# Patient Record
Sex: Female | Born: 1980 | Race: White | Hispanic: No | Marital: Married | Smoking: Never smoker
Health system: Southern US, Community
[De-identification: ages and names within clinical notes are randomized; demographics above are authoritative.]

## PROBLEM LIST (undated history)

## (undated) ENCOUNTER — Inpatient Hospital Stay (HOSPITAL_COMMUNITY): Payer: Self-pay

## (undated) DIAGNOSIS — J45909 Unspecified asthma, uncomplicated: Secondary | ICD-10-CM

## (undated) DIAGNOSIS — D696 Thrombocytopenia, unspecified: Secondary | ICD-10-CM

## (undated) DIAGNOSIS — G43909 Migraine, unspecified, not intractable, without status migrainosus: Secondary | ICD-10-CM

## (undated) DIAGNOSIS — K219 Gastro-esophageal reflux disease without esophagitis: Secondary | ICD-10-CM

## (undated) HISTORY — PX: COLONOSCOPY: SHX174

## (undated) HISTORY — PX: UPPER GI ENDOSCOPY: SHX6162

## (undated) HISTORY — DX: Unspecified asthma, uncomplicated: J45.909

## (undated) HISTORY — DX: Migraine, unspecified, not intractable, without status migrainosus: G43.909

---

## 2003-09-20 ENCOUNTER — Other Ambulatory Visit: Admission: RE | Admit: 2003-09-20 | Discharge: 2003-09-20 | Payer: Self-pay | Admitting: Obstetrics and Gynecology

## 2003-11-01 ENCOUNTER — Ambulatory Visit (HOSPITAL_COMMUNITY): Admission: RE | Admit: 2003-11-01 | Discharge: 2003-11-01 | Payer: Self-pay | Admitting: Obstetrics and Gynecology

## 2004-04-13 ENCOUNTER — Encounter (INDEPENDENT_AMBULATORY_CARE_PROVIDER_SITE_OTHER): Payer: Self-pay | Admitting: Specialist

## 2004-04-13 ENCOUNTER — Inpatient Hospital Stay (HOSPITAL_COMMUNITY): Admission: AD | Admit: 2004-04-13 | Discharge: 2004-04-16 | Payer: Self-pay | Admitting: Obstetrics and Gynecology

## 2005-05-27 ENCOUNTER — Other Ambulatory Visit: Admission: RE | Admit: 2005-05-27 | Discharge: 2005-05-27 | Payer: Self-pay | Admitting: Obstetrics and Gynecology

## 2005-10-20 ENCOUNTER — Encounter: Admission: RE | Admit: 2005-10-20 | Discharge: 2005-10-20 | Payer: Self-pay | Admitting: Family Medicine

## 2010-02-19 ENCOUNTER — Other Ambulatory Visit: Admission: RE | Admit: 2010-02-19 | Discharge: 2010-02-19 | Payer: Self-pay | Admitting: Obstetrics and Gynecology

## 2010-11-22 NOTE — H&P (Signed)
[name], [name]            ACCOUNT NO.:  [id number]   MEDICAL RECORD NO.:  [id number]          PATIENT TYPE:  MAT   LOCATION:  [facility]                          [facility]:  [facility]   PHYSICIAN:  Charles A. Delcambre, MDDATE OF BIRTH:  Dec 01, 1980   DATE OF ADMISSION:  04/09/2004  DATE OF DISCHARGE:                                HISTORY & PHYSICAL   HISTORY OF PRESENT ILLNESS:  She is a 30 year old, para 0-0-0-0 with EDC of  April 04, 2004, planned to be 41 weeks three days date of induction.  She is to be admitted on April 13, 2004 for Cytotec induction to be begun  and induced on April 14, 2004 for postdates.  She will have antenatal  testing.  AFI today is 17.6 cm.  She will have physical profile, NST on  April 12, 2004.   Prenatal labs include blood type O positive, antibody screen negative, VDRL  nonreactive.  Rubella immuned, hepatitis B surface-antigen negative.  HIV  declined.  Pap negative.  GC and Chlamydia negative.  Group B strep  negative.  Hepatitis C negative.  TSH normal.  Cystic fibrosis negative.  One hour Glucola 116.  Hemoglobin 11.9 at 28 weeks.  Quad screen normal.   PAST MEDICAL HISTORY:  1.  Asthma currently on no medications.  2.  Albuterol inhaler used p.r.n. but not currently necessary.  3.  Migraine headaches.   PAST SURGICAL HISTORY:  None.   MEDICATIONS:  Prenatal vitamins and Albuterol inhaler p.r.n.   ALLERGIES:  SULFA, reaction not stated.   SOCIAL HISTORY:  No tobacco, ethanol, or drug abuse.  The patient is married  and in a monogamous relationship with her husband.   FAMILY HISTORY:  Uncle with myocardial infarction.  Paternal grandfather  with emphysema.  Diabetes in grandfather and grandmother.  Mother with  thyroid cancer.  Brother has seizure disorder.  Brother has juvenile  arthritis.   REVIEW OF SYMPTOMS:  Denies fever, chills, nausea, vomiting, diarrhea,  constipation, bleeding, ruptured membranes, current contractions at  this  time.  She notes active fetal movement at this time.   PHYSICAL EXAMINATION:  GENERAL:  Alert and oriented x 3.  No distress.  VITAL SIGNS:  Blood pressure 122/82, respirations 16, pulse 90.  Afebrile.  HEENT:  Grossly within normal limits.  NECK:  Supple without thyromegaly or adenopathy.  LUNGS:  Clear bilaterally.  BACK:  No CVAT.  Vertebral column nontender to palpation.  HEART:  Regular rate and rhythm.  A 2/6 systolic ejection murmur at the left  sternal border.  BREASTS:  Symmetrical.  No dominant masses, tenderness, tissue, or skin  changes bilaterally.  ABDOMEN:  Gravid.  Fundal height 39 cm.  Estimated fetal weight at this time  is 3600 to 3800 gm.  Vertex on Leopold's.  PELVIC:  Vault without discharge or lesions.  Normal external female  genitalia.  Digital exam shows cervix is 1 cm, 25% effaced,  -1 station,  vertex is intact.  Midplane soft.  EXTREMITIES:  Mild edema bilaterally.  Nontender.   ASSESSMENT:  Intrauterine pregnancy at 41 weeks and 3 days.  Planned  for  this admission postdate.   PLAN:  The patient gives informed consent for Cytotec used off label for  induction, cervical ripening.  We will admit for cervical ripening with  Cytotec 25 mcg q.4h.  Plan is to switch over to high dose Pitocin the  morning after admission.  Initially admitted for Cytotec on April 13, 2004  and to begin high dose Pitocin protocol on April 14, 2004.  We will use  retained on admission labs.  She is group B strep negative.  Stadol 1 mg  q.1h. p.r.n., Phenergan 12.5 mg q.2h. p.r.n., epidural p.r.n. q.3-4h. at 3  to 4 cm dilated.  We will plan artificial rupture of membranes once cervix  is ready on the admission to augment labor.  All questions are answered.  The patient gives informed consent.  Wished to proceed as outlined.      CAD/MEDQ  D:  04/09/2004  T:  04/09/2004  Job:  [id number]

## 2010-11-22 NOTE — Op Note (Signed)
NAMEAMAL, [name]            ACCOUNT NO.:  [id number]   MEDICAL RECORD NO.:  [id number]          PATIENT TYPE:  INP   LOCATION:  9167                          [facility]ILITY:  [facility]   PHYSICIAN:  Charles A. Delcambre, MDDATE OF BIRTH:  June 16, 1981   DATE OF PROCEDURE:  04/13/2004  DATE OF DISCHARGE:                                 OPERATIVE REPORT   PREOPERATIVE DIAGNOSES:  1.  Arrest of dilation at 6 cm.  2.  Post-dates pregnancy.   POSTOPERATIVE DIAGNOSES:  1.  Arrest of dilation at 6 cm.  2.  Post-dates pregnancy.   PROCEDURE:  Primary low transverse cesarean section.   SURGEON:  Charles A. Sydnee Cabal, M.D.   ASSISTANT:  None.   COMPLICATIONS:  None.   ESTIMATED BLOOD LOSS:  600 mL.   OPERATIVE FINDINGS:  A vigorous female, Apgars 9 and 9.  Placenta to  pathology.  Cord arterial blood gas 7.30, venous blood gas not available at  this time.  See lab sheets.   ANESTHESIA:  General by the endotracheal route.   Instrument, sponge, and needle count correct x2.   DESCRIPTION OF PROCEDURE:  The patient was taken to the operating room and  placed in the supine position.  Sterile prep and drape was undertaken.  A  general anesthetic was induced.  The procedure was then undertaken.  A  Pfannenstiel incision was made with a knife, carried down to fascia, and the  fascia was incised with a knife and Mayo scissors.  The rectus sheath was  sharply dissected inferiorly and superiorly.  Rectus muscles were bluntly  dissected in the midline.  The peritoneum was entered with Metzenbaum  scissors.  Traction was used to extend the incision.  The bladder was  placed.  The vesicouterine peritoneum was incised with the Metzenbaum  scissors.  Blunt dissection was used to develop the bladder flap.  A lower  uterine segment transverse incision was made to amniotomy.  A hand was  inserted.  The occiput was lifted out of the pelvis and fundal pressure was  placed by the operator's assistant for  delivery without difficulty.  Cord  was clamped and the infant was handed off to neonatology personnel.  Cord  gases were taken.  The placenta was manually expressed and sent to  pathology.  The internal surface of the uterus was wiped with a moistened  lap.  The uterus was externalized for repair.  A single-layer closure was  undertaken with running #1 chromic.  Hemostasis was excellent, and a  judgment was made at that time with excellent hemostasis and the patient's  platelets being in the 80s, a second layer closure with the risk of starting  bleeding, a second layer would not be done.  The uterus was then re-  internalized after irrigation.  Hemostasis of the closure was again  verified.  Paracolic gutters were cleansed of clot and blood and material.  Irrigation was carried out.  Bladder flap hemostasis was excellent.  Irrigation was carried out.  Subfascial hemostasis was excellent.  The  fascia was closed with #1 Vicryl.  Subcutaneous hemostasis was excellent.  The skin was closed with skin clips.  A sterile dressing was applied.  The  patient tolerated the procedure well.  She was taken to the recovery room  with the physician in attendance.      CAD/MEDQ  D:  04/13/2004  T:  04/13/2004  Job:  [id number]

## 2010-11-22 NOTE — Discharge Summary (Signed)
NAMEHALCYON, HECK            ACCOUNT NO.:  [id number]   MEDICAL RECORD NO.:  [id number]          PATIENT TYPE:  INP   LOCATION:  9146                          [facility]   PHYSICIAN:  Charles A. Delcambre, MDDATE OF BIRTH:  10-03-1980   DATE OF ADMISSION:  04/13/2004  DATE OF DISCHARGE:  04/16/2004                                 DISCHARGE SUMMARY   DISCHARGE DIAGNOSES:  1.  Intrauterine pregnancy at 41 weeks 3 days.  2.  Arrest of dilation at 6 cm.   PROCEDURE:  Primary low transverse cesarean section.   DISPOSITION:  The patient discharged home to follow up in the office in 24  hours to discontinue staples.  She was given convalescent instructions,  notify of temperature greater than 100 degrees, increased pain or bleeding,  incisional drainage, no lifting greater than 25 pounds for 1 month, no  driving for 2 weeks.  A prescription for Percocet #40 one to two p.o. q.4h.  p.r.n., #30 of Chromagen one once a day given with instructions.  Discuss  birth control at 6 weeks.  Postoperative hematocrit 28.5, hemoglobin 9.9.  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  No  evidence of preeclampsia.  Platelets at time of surgery 81, 86 on admission.  Otherwise, pregnancy-induced hypertension labs normal with the exception of  alk phos 201, LDH at one check was 38 with normal up to 37.   HISTORY AND PHYSICAL:  As dictated on the chart.   HOSPITAL COURSE:  The patient was admitted, underwent induction, and  progressed on to labor, became arrested at dilation of 6 cm.  She was taken  for cesarean section at that time.  Surgery was accomplished without  difficulty.  She was monitored for any signs or symptoms of pregnancy-  induced hypertension and none were found.  Labs were continued to be stable  and normal with the exception of the platelets.  Platelets were stable in  the low 80s.  She had routine postoperative course otherwise.  Foley  catheter was  discontinued postoperative day #1.  She voided without  difficulty.  Pain was controlled with p.o. medication.  Diet was advanced  postoperative day #1 after flatus returned.  She had good pain control, diet  was tolerated, and she was discharged home with follow-up as noted above  postoperative day #3.      CAD/MEDQ  D:  05/01/2004  T:  05/01/2004  Job:  [id number]

## 2011-02-26 ENCOUNTER — Other Ambulatory Visit (HOSPITAL_COMMUNITY)
Admission: RE | Admit: 2011-02-26 | Discharge: 2011-02-26 | Disposition: A | Discharge status: Home / Self Care | Payer: BC Managed Care – PPO | Source: Ambulatory Visit | Attending: Obstetrics and Gynecology | Admitting: Obstetrics and Gynecology

## 2011-02-26 ENCOUNTER — Other Ambulatory Visit: Payer: Self-pay | Admitting: Obstetrics and Gynecology

## 2011-02-26 DIAGNOSIS — Z01419 Encounter for gynecological examination (general) (routine) without abnormal findings: Secondary | ICD-10-CM | POA: Insufficient documentation

## 2012-07-19 ENCOUNTER — Other Ambulatory Visit: Payer: Self-pay | Admitting: Occupational Medicine

## 2012-07-19 ENCOUNTER — Ambulatory Visit: Payer: Self-pay

## 2012-07-19 DIAGNOSIS — R52 Pain, unspecified: Secondary | ICD-10-CM

## 2014-02-15 ENCOUNTER — Other Ambulatory Visit: Payer: Self-pay | Admitting: Family Medicine

## 2014-02-15 ENCOUNTER — Encounter (INDEPENDENT_AMBULATORY_CARE_PROVIDER_SITE_OTHER): Payer: Self-pay

## 2014-02-15 ENCOUNTER — Ambulatory Visit
Admission: RE | Admit: 2014-02-15 | Discharge: 2014-02-15 | Disposition: A | Discharge status: Home / Self Care | Payer: BC Managed Care – PPO | Source: Ambulatory Visit | Attending: Family Medicine | Admitting: Family Medicine

## 2014-02-15 DIAGNOSIS — R0600 Dyspnea, unspecified: Secondary | ICD-10-CM

## 2014-02-15 DIAGNOSIS — R079 Chest pain, unspecified: Secondary | ICD-10-CM

## 2014-12-15 ENCOUNTER — Other Ambulatory Visit: Payer: Self-pay | Admitting: Physician Assistant

## 2014-12-15 ENCOUNTER — Other Ambulatory Visit (HOSPITAL_COMMUNITY)
Admission: RE | Admit: 2014-12-15 | Discharge: 2014-12-15 | Disposition: A | Discharge status: Home / Self Care | Payer: BC Managed Care – PPO | Source: Ambulatory Visit | Attending: Family Medicine | Admitting: Family Medicine

## 2014-12-15 DIAGNOSIS — Z124 Encounter for screening for malignant neoplasm of cervix: Secondary | ICD-10-CM | POA: Insufficient documentation

## 2014-12-19 LAB — CYTOLOGY - PAP

## 2015-08-30 LAB — OB RESULTS CONSOLE HGB/HCT, BLOOD
HEMATOCRIT: 40 %
HEMOGLOBIN: 13.2 g/dL

## 2015-08-30 LAB — OB RESULTS CONSOLE RPR: RPR: NONREACTIVE

## 2015-08-30 LAB — OB RESULTS CONSOLE PLATELET COUNT: Platelets: 160 10*3/uL

## 2015-08-30 LAB — OB RESULTS CONSOLE RUBELLA ANTIBODY, IGM
RUBELLA: IMMUNE
Rubella: IMMUNE
Rubella: NON-IMMUNE/NOT IMMUNE

## 2015-08-30 LAB — OB RESULTS CONSOLE HEPATITIS B SURFACE ANTIGEN: Hepatitis B Surface Ag: NEGATIVE

## 2015-08-30 LAB — OB RESULTS CONSOLE ABO/RH: RH Type: POSITIVE

## 2015-08-30 LAB — OB RESULTS CONSOLE HIV ANTIBODY (ROUTINE TESTING): HIV: NONREACTIVE

## 2015-08-30 LAB — OB RESULTS CONSOLE ANTIBODY SCREEN: Antibody Screen: NEGATIVE

## 2015-09-20 ENCOUNTER — Ambulatory Visit (INDEPENDENT_AMBULATORY_CARE_PROVIDER_SITE_OTHER): Payer: BC Managed Care – PPO | Admitting: Obstetrics & Gynecology

## 2015-09-20 ENCOUNTER — Encounter: Payer: Self-pay | Admitting: Obstetrics & Gynecology

## 2015-09-20 VITALS — BP 122/82 | HR 98 | Ht 67.0 in | Wt 158.0 lb

## 2015-09-20 DIAGNOSIS — O99519 Diseases of the respiratory system complicating pregnancy, unspecified trimester: Principal | ICD-10-CM

## 2015-09-20 DIAGNOSIS — Z3490 Encounter for supervision of normal pregnancy, unspecified, unspecified trimester: Secondary | ICD-10-CM | POA: Insufficient documentation

## 2015-09-20 DIAGNOSIS — O9989 Other specified diseases and conditions complicating pregnancy, childbirth and the puerperium: Secondary | ICD-10-CM

## 2015-09-20 DIAGNOSIS — J069 Acute upper respiratory infection, unspecified: Secondary | ICD-10-CM

## 2015-09-20 DIAGNOSIS — O34219 Maternal care for unspecified type scar from previous cesarean delivery: Secondary | ICD-10-CM

## 2015-09-20 DIAGNOSIS — Z36 Encounter for antenatal screening of mother: Secondary | ICD-10-CM

## 2015-09-20 DIAGNOSIS — J45909 Unspecified asthma, uncomplicated: Secondary | ICD-10-CM

## 2015-09-20 DIAGNOSIS — Z3481 Encounter for supervision of other normal pregnancy, first trimester: Secondary | ICD-10-CM | POA: Diagnosis not present

## 2015-09-20 NOTE — Patient Instructions (Signed)
First Trimester of Pregnancy The first trimester of pregnancy is from week 1 until the end of week 12 (months 1 through 3). A week after a sperm fertilizes an egg, the egg will implant on the wall of the uterus. This embryo will begin to develop into a baby. Genes from you and your partner are forming the baby. The female genes determine whether the baby is a boy or a girl. At 6-8 weeks, the eyes and face are formed, and the heartbeat can be seen on ultrasound. At the end of 12 weeks, all the baby's organs are formed.  Now that you are pregnant, you will want to do everything you can to have a healthy baby. Two of the most important things are to get good prenatal care and to follow your health care provider's instructions. Prenatal care is all the medical care you receive before the baby's birth. This care will help prevent, find, and treat any problems during the pregnancy and childbirth. BODY CHANGES Your body goes through many changes during pregnancy. The changes vary from woman to woman.   You may gain or lose a couple of pounds at first.  You may feel sick to your stomach (nauseous) and throw up (vomit). If the vomiting is uncontrollable, call your health care provider.  You may tire easily.  You may develop headaches that can be relieved by medicines approved by your health care provider.  You may urinate more often. Painful urination may mean you have a bladder infection.  You may develop heartburn as a result of your pregnancy.  You may develop constipation because certain hormones are causing the muscles that push waste through your intestines to slow down.  You may develop hemorrhoids or swollen, bulging veins (varicose veins).  Your breasts may begin to grow larger and become tender. Your nipples may stick out more, and the tissue that surrounds them (areola) may become darker.  Your gums may bleed and may be sensitive to brushing and flossing.  Dark spots or blotches (chloasma,  mask of pregnancy) may develop on your face. This will likely fade after the baby is born.  Your menstrual periods will stop.  You may have a loss of appetite.  You may develop cravings for certain kinds of food.  You may have changes in your emotions from day to day, such as being excited to be pregnant or being concerned that something may go wrong with the pregnancy and baby.  You may have more vivid and strange dreams.  You may have changes in your hair. These can include thickening of your hair, rapid growth, and changes in texture. Some women also have hair loss during or after pregnancy, or hair that feels dry or thin. Your hair will most likely return to normal after your baby is born. WHAT TO EXPECT AT YOUR PRENATAL VISITS During a routine prenatal visit:  You will be weighed to make sure you and the baby are growing normally.  Your blood pressure will be taken.  Your abdomen will be measured to track your baby's growth.  The fetal heartbeat will be listened to starting around week 10 or 12 of your pregnancy.  Test results from any previous visits will be discussed. Your health care provider may ask you:  How you are feeling.  If you are feeling the baby move.  If you have had any abnormal symptoms, such as leaking fluid, bleeding, severe headaches, or abdominal cramping.  If you are using any tobacco products,   including cigarettes, chewing tobacco, and electronic cigarettes.  If you have any questions. Other tests that may be performed during your first trimester include:  Blood tests to find your blood type and to check for the presence of any previous infections. They will also be used to check for low iron levels (anemia) and Rh antibodies. Later in the pregnancy, blood tests for diabetes will be done along with other tests if problems develop.  Urine tests to check for infections, diabetes, or protein in the urine.  An ultrasound to confirm the proper growth  and development of the baby.  An amniocentesis to check for possible genetic problems.  Fetal screens for spina bifida and Down syndrome.  You may need other tests to make sure you and the baby are doing well.  HIV (human immunodeficiency virus) testing. Routine prenatal testing includes screening for HIV, unless you choose not to have this test. HOME CARE INSTRUCTIONS  Medicines  Follow your health care provider's instructions regarding medicine use. Specific medicines may be either safe or unsafe to take during pregnancy.  Take your prenatal vitamins as directed.  If you develop constipation, try taking a stool softener if your health care provider approves. Diet  Eat regular, well-balanced meals. Choose a variety of foods, such as meat or vegetable-based protein, fish, milk and low-fat dairy products, vegetables, fruits, and whole grain breads and cereals. Your health care provider will help you determine the amount of weight gain that is right for you.  Avoid raw meat and uncooked cheese. These carry germs that can cause birth defects in the baby.  Eating four or five small meals rather than three large meals a day may help relieve nausea and vomiting. If you start to feel nauseous, eating a few soda crackers can be helpful. Drinking liquids between meals instead of during meals also seems to help nausea and vomiting.  If you develop constipation, eat more high-fiber foods, such as fresh vegetables or fruit and whole grains. Drink enough fluids to keep your urine clear or pale yellow. Activity and Exercise  Exercise only as directed by your health care provider. Exercising will help you:  Control your weight.  Stay in shape.  Be prepared for labor and delivery.  Experiencing pain or cramping in the lower abdomen or low back is a good sign that you should stop exercising. Check with your health care provider before continuing normal exercises.  Try to avoid standing for long  periods of time. Move your legs often if you must stand in one place for a long time.  Avoid heavy lifting.  Wear low-heeled shoes, and practice good posture.  You may continue to have sex unless your health care provider directs you otherwise. Relief of Pain or Discomfort  Wear a good support bra for breast tenderness.   Take warm sitz baths to soothe any pain or discomfort caused by hemorrhoids. Use hemorrhoid cream if your health care provider approves.   Rest with your legs elevated if you have leg cramps or low back pain.  If you develop varicose veins in your legs, wear support hose. Elevate your feet for 15 minutes, 3-4 times a day. Limit salt in your diet. Prenatal Care  Schedule your prenatal visits by the twelfth week of pregnancy. They are usually scheduled monthly at first, then more often in the last 2 months before delivery.  Write down your questions. Take them to your prenatal visits.  Keep all your prenatal visits as directed by your   health care provider. Safety  Wear your seat belt at all times when driving.  Make a list of emergency phone numbers, including numbers for family, friends, the hospital, and police and fire departments. General Tips  Ask your health care provider for a referral to a local prenatal education class. Begin classes no later than at the beginning of month 6 of your pregnancy.  Ask for help if you have counseling or nutritional needs during pregnancy. Your health care provider can offer advice or refer you to specialists for help with various needs.  Do not use hot tubs, steam rooms, or saunas.  Do not douche or use tampons or scented sanitary pads.  Do not cross your legs for long periods of time.  Avoid cat litter boxes and soil used by cats. These carry germs that can cause birth defects in the baby and possibly loss of the fetus by miscarriage or stillbirth.  Avoid all smoking, herbs, alcohol, and medicines  not prescribed by your health care provider. Chemicals in these affect the formation and growth of the baby.  Do not use any tobacco products, including cigarettes, chewing tobacco, and electronic cigarettes. If you need help quitting, ask your health care provider. You may receive counseling support and other resources to help you quit.  Schedule a dentist appointment. At home, brush your teeth with a soft toothbrush and be gentle when you floss. SEEK MEDICAL CARE IF:   You have dizziness.  You have mild pelvic cramps, pelvic pressure, or nagging pain in the abdominal area.  You have persistent nausea, vomiting, or diarrhea.  You have a bad smelling vaginal discharge.  You have pain with urination.  You notice increased swelling in your face, hands, legs, or ankles. SEEK IMMEDIATE MEDICAL CARE IF:   You have a fever.  You are leaking fluid from your vagina.  You have spotting or bleeding from your vagina.  You have severe abdominal cramping or pain.  You have rapid weight gain or loss.  You vomit blood or material that looks like coffee grounds.  You are exposed to German measles and have never had them.  You are exposed to fifth disease or chickenpox.  You develop a severe headache.  You have shortness of breath.  You have any kind of trauma, such as from a fall or a car accident.   This information is not intended to replace advice given to you by your health care provider. Make sure you discuss any questions you have with your health care provider.   Document Released: 06/17/2001 Document Revised: 07/14/2014 Document Reviewed: 05/03/2013 Elsevier Interactive Patient Education 2016 [employer]  Vaginal Birth After Cesarean Delivery Vaginal birth after cesarean delivery (VBAC) is giving birth vaginally after previously delivering a baby by a cesarean. In the past, if a woman had a cesarean delivery, all births afterward would be done by cesarean delivery. This is  no longer true. It can be safe for the mother to try a vaginal delivery after having a cesarean delivery.  It is important to discuss VBAC with your health care provider early in the pregnancy so you can understand the risks, benefits, and options. It will give you time to decide what is best in your particular case. The final decision about whether to have a VBAC or repeat cesarean delivery should be between you and your health care provider. Any changes in your health or your baby's health during your pregnancy may make it necessary to change your initial decision   decision about VBAC.  WOMEN WHO PLAN TO HAVE A VBAC SHOULD CHECK WITH THEIR HEALTH CARE PROVIDER TO BE SURE THAT:  The previous cesarean delivery was done with a low transverse uterine cut (incision) (not a vertical classical incision).   The birth canal is big enough for the baby.   There were no other operations on the uterus.   An electronic fetal monitor (EFM) will be on at all times during labor.   An operating room will be available and ready in case an emergency cesarean delivery is needed.   A health care provider and surgical nursing staff will be available at all times during labor to be ready to do an emergency delivery cesarean if necessary.   An anesthesiologist will be present in case an emergency cesarean delivery is needed.   The nursery is prepared and has adequate personnel and necessary equipment available to care for the baby in case of an emergency cesarean delivery. BENEFITS OF VBAC  Shorter stay in the hospital.   Avoidance of risks associated with cesarean delivery, such as:  Surgical complications, such as opening of the incision or hernia in the incision.  Injury to other organs.  Fever. This can occur if an infection develops after surgery. It can also occur as a reaction to the medicine given to make you numb during the surgery.  Less blood loss and need for blood transfusions.  Lower risk of blood clots and  infection.  Shorter recovery.   Decreased risk for having to remove the uterus (hysterectomy).   Decreased risk for the placenta to completely or partially cover the opening of the uterus (placenta previa) with a future pregnancy.   Decrease risk in future labor and delivery. RISKS OF A VBAC  Tearing (rupture) of the uterus. This is occurs in less than 1% of VBACs. The risk of this happening is higher if:  Steps are taken to begin the labor process (induce labor) or stimulate or strengthen contractions (augment labor).   Medicine is used to soften (ripen) the cervix.  Having to remove the uterus (hysterectomy) if it ruptures. VBAC SHOULD NOT BE DONE IF:  The previous cesarean delivery was done with a vertical (classical) or T-shaped incision or you do not know what kind of incision was made.   You had a ruptured uterus.   You have had certain types of surgery on your uterus, such as removal of uterine fibroids. Ask your health care provider about other types of surgeries that prevent you from having a VBAC.  You have certain medical or childbirth (obstetrical) problems.   There are problems with the baby.   You have had two previous cesarean deliveries and no vaginal deliveries. OTHER FACTS TO KNOW ABOUT VBAC:  It is safe to have an epidural anesthetic with VBAC.   It is safe to turn the baby from a breech position (attempt an external cephalic version).   It is safe to try a VBAC with twins.   VBAC may not be successful if your baby weights 8.8 lb (4 kg) or more. However, weight predictions are not always accurate and should not be used alone to decide if VBAC is right for you.  There is an increased failure rate if the time between the cesarean delivery and VBAC is less than 19 months.   Your health care provider may advise against a VBAC if you have preeclampsia (high blood pressure, protein in the urine, and swelling of face and extremities).  VBAC is often successful if you previously gave birth vaginally.   VBAC is often successful when the labor starts spontaneously before the due date.   Delivering a baby through a VBAC is similar to having a normal spontaneous vaginal delivery.   This information is not intended to replace advice given to you by your health care provider. Make sure you discuss any questions you have with your health care provider.   Document Released: 12/14/2006 Document Revised: 07/14/2014 Document Reviewed: 01/20/2013 Elsevier Interactive Patient Education 2016 [employer]  

## 2015-09-20 NOTE — Progress Notes (Signed)
Subjective:    [name] is a 35 y.o. G2P1001 at 4737w2d, by LMP consistent with today's scan, being seen today for her first obstetrical visit in this clinic.  [facility], had a visit there and had prenatal labs done.  She wanted to come here due to proximity of this clinic to her home, understands that [facility] also [facility].   Her obstetrical history is significant for previous cesarean section for arrest of dilation.  Also had gestational thrombocytopenia and platelet count was 80K at delivery, general anesthesia utilized. Patient does intend to breast feed. Pregnancy history fully reviewed.  Patient reports URI symptoms (cough, sneezing). No fevers, chills, myalgia or other concerns.  Wants to know what she can take.  Has been using albuterol neb treatments as needed.  Accompanied by her husband.   Filed Vitals:   09/20/15 0842 09/20/15 0856  BP: 122/82   Pulse: 98   Height:  5\' 7"  (1.702 m)  Weight: 158 lb (71.668 kg)     HISTORY: OB History  Gravida Para Term Preterm AB SAB TAB Ectopic Multiple Living  2 1 1  0 0 0 0 0 0 1    # Outcome Date GA Lbr Len/2nd Weight Sex Delivery Anes PTL Lv  2 Current           1 Term 04/13/04 3274w3d       Y     Past Medical History  Diagnosis Date  . Asthma   . Migraines    Past Surgical History  Procedure Laterality Date  . Cesarean section  10.02/2004  . Upper gi endoscopy      35 years old  . Colonoscopy      35 years old   Family History  Problem Relation Age of Onset  . Diabetes Mother   . Cervical cancer Maternal Grandmother   . Cervical cancer Paternal Grandmother   . Breast cancer Maternal Aunt   . Anuerysm Mother   . Mitral valve prolapse Mother   . Fibromyalgia Mother      Exam    Uterus:     Pelvic Exam: Deferred  System: Breast:  Deferred   Skin: normal coloration and turgor, no rashes   Neurologic: oriented, normal, negative   Extremities: normal strength, tone,  and muscle mass, no deformities   HEENT PERRLA, extra ocular movement intact and sclera clear, anicteric   Mouth/Teeth mucous membranes moist, pharynx normal without lesions and dental hygiene good   Neck supple and no masses   Cardiovascular: regular rate and rhythm   Respiratory:  appears well, vitals normal, no respiratory distress, acyanotic, normal RR, nasal congestion with pale boggy mucosa noted, chest clear, no wheezing, crepitations, rhonchi, normal symmetric air entry   Abdomen: soft, non-tender; bowel sounds normal; no masses,  no organomegaly      Assessment:    Pregnancy: G2P1001 Patient Active Problem List   Diagnosis Date Noted  . Asthma affecting pregnancy, antepartum 09/20/2015  . Supervision of normal pregnancy, antepartum 09/20/2015  . Previous cesarean delivery x1 in 2005 09/20/2015    Plan:     [facility] and initial labs reviewed Continue Prenatal vitamins. Recommended OTC antitussive agents, also recommended Singulair or other asthma maintenance medication. She will follow up with PCP. Patient enrolled in Bab[facility]s program. Problem list reviewed and updated. Genetic Screening discussed; will decide by next visit in 2 weeks. Ultrasound discussed; fetal survey: to be ordered later. The nature of [facility] with multiple MDs and other Advanced Practice Providers was explained to patient; also emphasized that residents, students are part of our team. Routine obstetric precautions reviewed.    Tereso Newcomer, MD 09/20/2015

## 2015-09-20 NOTE — Progress Notes (Signed)
Last pap 12/2014 - all paps have been normal since 2003.  Bedside US shows single IUP 6553w4d and FHR 178

## 2015-09-24 ENCOUNTER — Encounter: Payer: Self-pay | Admitting: *Deleted

## 2015-10-01 ENCOUNTER — Ambulatory Visit (INDEPENDENT_AMBULATORY_CARE_PROVIDER_SITE_OTHER): Payer: BC Managed Care – PPO | Admitting: Obstetrics & Gynecology

## 2015-10-01 VITALS — BP 130/86 | HR 91 | Wt 160.0 lb

## 2015-10-01 DIAGNOSIS — Z1389 Encounter for screening for other disorder: Secondary | ICD-10-CM

## 2015-10-01 DIAGNOSIS — Z36 Encounter for antenatal screening of mother: Secondary | ICD-10-CM | POA: Diagnosis not present

## 2015-10-01 DIAGNOSIS — Z3481 Encounter for supervision of other normal pregnancy, first trimester: Secondary | ICD-10-CM

## 2015-10-01 NOTE — Progress Notes (Signed)
Subjective:  [name] is a 35 y.o. G2P1001 at 776w6d being seen today for ongoing prenatal care.  She is currently monitored for the following issues for this low-risk pregnancy and has Asthma affecting pregnancy, antepartum; Supervision of normal pregnancy, antepartum; and Previous cesarean delivery x1 in 2005 on her problem list.  Patient reports no complaints.  Contractions: Not present. Vag. Bleeding: None.  Movement: Absent. Denies leaking of fluid.   The following portions of the patient's history were reviewed and updated as appropriate: allergies, current medications, past family history, past medical history, past social history, past surgical history and problem list. Problem list updated.  Objective:   Filed Vitals:   10/01/15 1535  BP: 130/86  Pulse: 91  Weight: 160 lb (72.576 kg)    Fetal Status: Fetal Heart Rate (bpm): 168 on u/s   Movement: Absent     General:  Alert, oriented and cooperative. Patient is in no acute distress.  Skin: Skin is warm and dry. No rash noted.   Cardiovascular: Normal heart rate noted  Respiratory: Normal respiratory effort, no problems with respiration noted  Abdomen: Soft, gravid, appropriate for gestational age. Pain/Pressure: Absent     Pelvic: Vag. Bleeding: None    Cervical exam deferred   Extremities: Normal range of motion.  Edema: None  Mental Status: Normal mood and affect. Normal behavior. Normal judgment and thought content.   Urinalysis: Urine Protein: Negative Urine Glucose: Negative  Assessment and Plan:  Pregnancy: G2P1001 at 776w6d  1. Encounter for routine screening for malformation using ultrasonics 2. Supervision of normal pregnancy, antepartum, first trimester Desires Panorama.  Anatomy scan ordered. - Genetic Screening - US MFM OB COMP + 14 WK; Future Routine obstetric precautions reviewed. Please refer to After Visit Summary for other counseling recommendations.  Return in about 8 weeks (around 11/27/2015)  for 20 week Babyscripts OB Visit.   Tereso NewcomerUgonna A Arasely Akkerman, MD

## 2015-10-01 NOTE — Patient Instructions (Addendum)
Thank you for enrolling in MyChart. Please follow the instructions below to securely access your online medical record. MyChart allows you to send messages to your doctor, view your test results, manage appointments, and more.   How Do I Sign Up? 1. In your Internet browser, go to [employer] and enter [email or web address]. 2. Click on the Sign Up Now link in the Sign In box. You will see the New Member Sign Up page. 3. Enter your MyChart Access Code exactly as it appears below. You will not need to use this code after you've completed the sign-up process. If you do not sign up before the expiration date, you must request a new code.  MyChart Access Code: [email or web address] Expires: 11/09/2015  3:58 PM  4. Enter your Social Security Number ([email or web address]) and Date of Birth (mm/dd/yyyy) as indicated and click Submit. You will be taken to the next sign-up page. 5. Create a MyChart ID. This will be your MyChart login ID and cannot be changed, so think of one that is secure and easy to remember. 6. Create a MyChart password. You can change your password at any time. 7. Enter your Password Reset Question and Answer. This can be used at a later time if you forget your password.  8. Enter your e-mail address. You will receive e-mail notification when new information is available in MyChart. 9. Click Sign Up. You can now view your medical record.   Additional Information Remember, MyChart is NOT to be used for urgent needs. For medical emergencies, dial 911.     Return to clinic for any scheduled appointments or obstetric concerns, or go to [facility] for evaluation Second Trimester of Pregnancy The second trimester is from week 13 through week 28, months 4 through 6. The second trimester is often a time when you feel your best. Your body has also adjusted to being pregnant, and you begin to feel better physically. Usually, morning sickness has lessened or quit completely, you may have more  energy, and you may have an increase in appetite. The second trimester is also a time when the fetus is growing rapidly. At the end of the sixth month, the fetus is about 9 inches long and weighs about 1 pounds. You will likely begin to feel the baby move (quickening) between 18 and 20 weeks of the pregnancy. BODY CHANGES Your body goes through many changes during pregnancy. The changes vary from woman to woman.   Your weight will continue to increase. You will notice your lower abdomen bulging out.  You may begin to get stretch marks on your hips, abdomen, and breasts.  You may develop headaches that can be relieved by medicines approved by your health care provider.  You may urinate more often because the fetus is pressing on your bladder.  You may develop or continue to have heartburn as a result of your pregnancy.  You may develop constipation because certain hormones are causing the muscles that push waste through your intestines to slow down.  You may develop hemorrhoids or swollen, bulging veins (varicose veins).  You may have back pain because of the weight gain and pregnancy hormones relaxing your joints between the bones in your pelvis and as a result of a shift in weight and the muscles that support your balance.  Your breasts will continue to grow and be tender.  Your gums may bleed and may be sensitive to brushing and flossing.  Dark spots or blotches (chloasma, mask of pregnancy) may develop on  your face. This will likely fade after the baby is born.  A dark line from your belly button to the pubic area (linea nigra) may appear. This will likely fade after the baby is born.  You may have changes in your hair. These can include thickening of your hair, rapid growth, and changes in texture. Some women also have hair loss during or after pregnancy, or hair that feels dry or thin. Your hair will most likely return to normal after your baby is born. WHAT TO EXPECT AT YOUR  PRENATAL VISITS During a routine prenatal visit:  You will be weighed to make sure you and the fetus are growing normally.  Your blood pressure will be taken.  Your abdomen will be measured to track your baby's growth.  The fetal heartbeat will be listened to.  Any test results from the previous visit will be discussed. Your health care provider may ask you:  How you are feeling.  If you are feeling the baby move.  If you have had any abnormal symptoms, such as leaking fluid, bleeding, severe headaches, or abdominal cramping.  If you are using any tobacco products, including cigarettes, chewing tobacco, and electronic cigarettes.  If you have any questions. Other tests that may be performed during your second trimester include:  Blood tests that check for:  Low iron levels (anemia).  Gestational diabetes (between 24 and 28 weeks).  Rh antibodies.  Urine tests to check for infections, diabetes, or protein in the urine.  An ultrasound to confirm the proper growth and development of the baby.  An amniocentesis to check for possible genetic problems.  Fetal screens for spina bifida and Down syndrome.  HIV (human immunodeficiency virus) testing. Routine prenatal testing includes screening for HIV, unless you choose not to have this test. HOME CARE INSTRUCTIONS   Avoid all smoking, herbs, alcohol, and unprescribed drugs. These chemicals affect the formation and growth of the baby.  Do not use any tobacco products, including cigarettes, chewing tobacco, and electronic cigarettes. If you need help quitting, ask your health care provider. You may receive counseling support and other resources to help you quit.  Follow your health care provider's instructions regarding medicine use. There are medicines that are either safe or unsafe to take during pregnancy.  Exercise only as directed by your health care provider. Experiencing uterine cramps is a good sign to stop  exercising.  Continue to eat regular, healthy meals.  Wear a good support bra for breast tenderness.  Do not use hot tubs, steam rooms, or saunas.  Wear your seat belt at all times when driving.  Avoid raw meat, uncooked cheese, cat litter boxes, and soil used by cats. These carry germs that can cause birth defects in the baby.  Take your prenatal vitamins.  Take 1500-2000 mg of calcium daily starting at the 20th week of pregnancy until you deliver your baby.  Try taking a stool softener (if your health care provider approves) if you develop constipation. Eat more high-fiber foods, such as fresh vegetables or fruit and whole grains. Drink plenty of fluids to keep your urine clear or pale yellow.  Take warm sitz baths to soothe any pain or discomfort caused by hemorrhoids. Use hemorrhoid cream if your health care provider approves.  If you develop varicose veins, wear support hose. Elevate your feet for 15 minutes, 3-4 times a day. Limit salt in your diet.  Avoid heavy lifting, wear low heel shoes, and practice good posture.  Rest with  your legs elevated if you have leg cramps or low back pain.  Visit your dentist if you have not gone yet during your pregnancy. Use a soft toothbrush to brush your teeth and be gentle when you floss.  A sexual relationship may be continued unless your health care provider directs you otherwise.  Continue to go to all your prenatal visits as directed by your health care provider. SEEK MEDICAL CARE IF:   You have dizziness.  You have mild pelvic cramps, pelvic pressure, or nagging pain in the abdominal area.  You have persistent nausea, vomiting, or diarrhea.  You have a bad smelling vaginal discharge.  You have pain with urination. SEEK IMMEDIATE MEDICAL CARE IF:   You have a fever.  You are leaking fluid from your vagina.  You have spotting or bleeding from your vagina.  You have severe abdominal cramping or pain.  You have rapid  weight gain or loss.  You have shortness of breath with chest pain.  You notice sudden or extreme swelling of your face, hands, ankles, feet, or legs.  You have not felt your baby move in over an hour.  You have severe headaches that do not go away with medicine.  You have vision changes.   This information is not intended to replace advice given to you by your health care provider. Make sure you discuss any questions you have with your health care provider.   Document Released: 06/17/2001 Document Revised: 07/14/2014 Document Reviewed: 08/24/2012 Elsevier Interactive Patient Education [employer]

## 2015-10-03 ENCOUNTER — Encounter: Payer: Self-pay | Admitting: *Deleted

## 2015-10-09 ENCOUNTER — Encounter: Payer: Self-pay | Admitting: *Deleted

## 2015-11-15 ENCOUNTER — Other Ambulatory Visit: Payer: Self-pay | Admitting: Obstetrics & Gynecology

## 2015-11-15 ENCOUNTER — Ambulatory Visit (HOSPITAL_COMMUNITY)
Admission: RE | Admit: 2015-11-15 | Discharge: 2015-11-15 | Disposition: A | Discharge status: Home / Self Care | Payer: BC Managed Care – PPO | Source: Ambulatory Visit | Attending: Obstetrics & Gynecology | Admitting: Obstetrics & Gynecology

## 2015-11-15 DIAGNOSIS — Z1389 Encounter for screening for other disorder: Secondary | ICD-10-CM

## 2015-11-15 DIAGNOSIS — Z3481 Encounter for supervision of other normal pregnancy, first trimester: Secondary | ICD-10-CM

## 2015-11-15 DIAGNOSIS — O09522 Supervision of elderly multigravida, second trimester: Secondary | ICD-10-CM | POA: Diagnosis not present

## 2015-11-15 DIAGNOSIS — Z3A18 18 weeks gestation of pregnancy: Secondary | ICD-10-CM | POA: Diagnosis not present

## 2015-11-15 DIAGNOSIS — Z3480 Encounter for supervision of other normal pregnancy, unspecified trimester: Secondary | ICD-10-CM | POA: Diagnosis present

## 2015-11-21 ENCOUNTER — Other Ambulatory Visit (HOSPITAL_COMMUNITY): Payer: Self-pay

## 2015-11-26 ENCOUNTER — Ambulatory Visit (INDEPENDENT_AMBULATORY_CARE_PROVIDER_SITE_OTHER): Payer: BC Managed Care – PPO | Admitting: Obstetrics and Gynecology

## 2015-11-26 VITALS — BP 129/80 | HR 99 | Wt 157.0 lb

## 2015-11-26 DIAGNOSIS — O9989 Other specified diseases and conditions complicating pregnancy, childbirth and the puerperium: Secondary | ICD-10-CM

## 2015-11-26 DIAGNOSIS — O34219 Maternal care for unspecified type scar from previous cesarean delivery: Secondary | ICD-10-CM

## 2015-11-26 DIAGNOSIS — O99519 Diseases of the respiratory system complicating pregnancy, unspecified trimester: Principal | ICD-10-CM

## 2015-11-26 DIAGNOSIS — Z3482 Encounter for supervision of other normal pregnancy, second trimester: Secondary | ICD-10-CM

## 2015-11-26 DIAGNOSIS — J45909 Unspecified asthma, uncomplicated: Secondary | ICD-10-CM

## 2015-11-26 NOTE — Progress Notes (Signed)
Subjective:  [name] is a 35 y.o. G2P1001 at 6958w6d being seen today for ongoing prenatal care.  She is currently monitored for the following issues for this low-risk pregnancy and has Asthma affecting pregnancy, antepartum; Supervision of normal pregnancy, antepartum; and Previous cesarean delivery x1 in 2005 on her problem list.  Patient reports no complaints.  Contractions: Not present. Vag. Bleeding: None.  Movement: Present. Denies leaking of fluid.   The following portions of the patient's history were reviewed and updated as appropriate: allergies, current medications, past family history, past medical history, past social history, past surgical history and problem list. Problem list updated.  Objective:   Filed Vitals:   11/26/15 1526  BP: 129/80  Pulse: 99  Weight: 157 lb (71.215 kg)    Fetal Status: Fetal Heart Rate (bpm): 160   Movement: Present     General:  Alert, oriented and cooperative. Patient is in no acute distress.  Skin: Skin is warm and dry. No rash noted.   Cardiovascular: Normal heart rate noted  Respiratory: Normal respiratory effort, no problems with respiration noted  Abdomen: Soft, gravid, appropriate for gestational age. Pain/Pressure: Absent     Pelvic: Vag. Bleeding: None Vag D/C Character: Thin   Cervical exam deferred        Extremities: Normal range of motion.  Edema: None  Mental Status: Normal mood and affect. Normal behavior. Normal judgment and thought content.   Urinalysis:      Assessment and Plan:  Pregnancy: G2P1001 at 6458w6d  1. Asthma affecting pregnancy, antepartum   2. Supervision of normal pregnancy, antepartum, second trimester Patient is doing well Ultrasound results reviewed. Patient reports being very concerned at the time of the ultrasound as it was a lengthy visit and the tech called in the genetic counselor Reassurane provided Patient to call us with any concerns following her follow up ultrasound to complete the  anatomy 1hr glucola at next visit  3. Previous cesarean delivery x1 in 2005 Patient remains undecided  General obstetric precautions including but not limited to vaginal bleeding, contractions, leaking of fluid and fetal movement were reviewed in detail with the patient. Please refer to After Visit Summary for other counseling recommendations.  Return in about 8 weeks (around 01/21/2016).   Catalina AntiguaPeggy Imogen Maddalena, MD

## 2015-12-13 ENCOUNTER — Encounter (HOSPITAL_COMMUNITY): Payer: Self-pay

## 2015-12-13 ENCOUNTER — Ambulatory Visit (HOSPITAL_COMMUNITY)
Admission: RE | Admit: 2015-12-13 | Discharge: 2015-12-13 | Disposition: A | Discharge status: Home / Self Care | Payer: BC Managed Care – PPO | Source: Ambulatory Visit | Attending: Obstetrics & Gynecology | Admitting: Obstetrics & Gynecology

## 2015-12-13 VITALS — BP 114/73 | HR 90 | Wt 158.4 lb

## 2015-12-13 DIAGNOSIS — Z36 Encounter for antenatal screening of mother: Secondary | ICD-10-CM | POA: Diagnosis not present

## 2015-12-13 DIAGNOSIS — O99512 Diseases of the respiratory system complicating pregnancy, second trimester: Secondary | ICD-10-CM | POA: Insufficient documentation

## 2015-12-13 DIAGNOSIS — O09522 Supervision of elderly multigravida, second trimester: Secondary | ICD-10-CM | POA: Insufficient documentation

## 2015-12-13 DIAGNOSIS — J45909 Unspecified asthma, uncomplicated: Secondary | ICD-10-CM | POA: Insufficient documentation

## 2015-12-13 DIAGNOSIS — Z3A22 22 weeks gestation of pregnancy: Secondary | ICD-10-CM | POA: Diagnosis not present

## 2015-12-13 DIAGNOSIS — O34219 Maternal care for unspecified type scar from previous cesarean delivery: Secondary | ICD-10-CM | POA: Insufficient documentation

## 2015-12-13 DIAGNOSIS — O99519 Diseases of the respiratory system complicating pregnancy, unspecified trimester: Secondary | ICD-10-CM

## 2015-12-13 DIAGNOSIS — Z3480 Encounter for supervision of other normal pregnancy, unspecified trimester: Secondary | ICD-10-CM

## 2016-01-09 ENCOUNTER — Encounter: Payer: Self-pay | Admitting: *Deleted

## 2016-01-18 ENCOUNTER — Ambulatory Visit (INDEPENDENT_AMBULATORY_CARE_PROVIDER_SITE_OTHER): Payer: BC Managed Care – PPO | Admitting: Obstetrics and Gynecology

## 2016-01-18 VITALS — BP 118/77 | HR 102 | Wt 163.0 lb

## 2016-01-18 DIAGNOSIS — Z23 Encounter for immunization: Secondary | ICD-10-CM

## 2016-01-18 DIAGNOSIS — Z3482 Encounter for supervision of other normal pregnancy, second trimester: Secondary | ICD-10-CM

## 2016-01-18 DIAGNOSIS — Z36 Encounter for antenatal screening of mother: Secondary | ICD-10-CM

## 2016-01-18 DIAGNOSIS — O34219 Maternal care for unspecified type scar from previous cesarean delivery: Secondary | ICD-10-CM

## 2016-01-18 DIAGNOSIS — Z3492 Encounter for supervision of normal pregnancy, unspecified, second trimester: Secondary | ICD-10-CM

## 2016-01-18 LAB — CBC
HCT: 34.6 % — ABNORMAL LOW (ref 35.0–45.0)
HEMOGLOBIN: 11.6 g/dL — AB (ref 11.7–15.5)
MCH: 32.4 pg (ref 27.0–33.0)
MCHC: 33.5 g/dL (ref 32.0–36.0)
MCV: 96.6 fL (ref 80.0–100.0)
MPV: 12.1 fL (ref 7.5–12.5)
PLATELETS: 110 10*3/uL — AB (ref 140–400)
RBC: 3.58 MIL/uL — AB (ref 3.80–5.10)
RDW: 14 % (ref 11.0–15.0)
WBC: 9.6 10*3/uL (ref 3.8–10.8)

## 2016-01-18 NOTE — Progress Notes (Signed)
Prenatal Visit Note Date: 01/18/2016 [facility] Subjective:  [name] is a 35 y.o. G2P1001 at 7270w3d being seen today for ongoing prenatal care.  She is currently monitored for the following issues for this low-risk pregnancy and has Asthma affecting pregnancy, antepartum; Supervision of normal pregnancy, antepartum; and Previous cesarean delivery x1 in 2005 on her problem list.  Patient reports no complaints.   Contractions: Not present. Vag. Bleeding: None.  Movement: Present. Denies leaking of fluid.   The following portions of the patient's history were reviewed and updated as appropriate: allergies, current medications, past family history, past medical history, past social history, past surgical history and problem list. Problem list updated.  Objective:   Filed Vitals:   01/18/16 0940  BP: 118/77  Pulse: 102  Weight: 163 lb (73.936 kg)    Fetal Status: Fetal Heart Rate (bpm): 153   Movement: Present     General:  Alert, oriented and cooperative. Patient is in no acute distress.  Skin: Skin is warm and dry. No rash noted.   Cardiovascular: Normal heart rate noted  Respiratory: Normal respiratory effort, no problems with respiration noted  Abdomen: Soft, gravid, appropriate for gestational age. Pain/Pressure: Present     Pelvic:  Cervical exam deferred        Extremities: Normal range of motion.  Edema: None  Mental Status: Normal mood and affect. Normal behavior. Normal judgment and thought content.   Urinalysis: Urine Protein: Trace Urine Glucose: Negative  Assessment and Plan:  Pregnancy: G2P1001 at 5670w3d  1. Supervision of normal pregnancy in second trimester 28wk labs today. D/w her mfm rec for third trimester growth due to AMA. Normal FH today. Pt to consider if she wants it.  - CBC - HIV antibody - RPR - Glucose Tolerance, 1 HR (50g) - Tdap vaccine greater than or equal to 7yo IM - US MFM OB FOLLOW UP; Future  2.  Supervision of normal pregnancy, antepartum, second trimester As above  3. Previous cesarean delivery x1 in 2005 Op note reviewed. Arrest of dilation. D/w her and she sounds interested in TOLAC. R/b/a d/w her and can review more sign TOLAC consent PRN.  Preterm labor symptoms and general obstetric precautions including but not limited to vaginal bleeding, contractions, leaking of fluid and fetal movement were reviewed in detail with the patient. Please refer to After Visit Summary for other counseling recommendations.  Return in about 2 weeks (around 02/01/2016).   [place] Bingharlie Estle Huguley, MD

## 2016-01-19 LAB — RPR

## 2016-01-19 LAB — GLUCOSE TOLERANCE, 1 HOUR (50G) W/O FASTING: Glucose, 1 Hr, gestational: 139 mg/dL (ref <140)

## 2016-01-19 LAB — HIV ANTIBODY (ROUTINE TESTING W REFLEX): HIV: NONREACTIVE

## 2016-01-21 ENCOUNTER — Encounter: Payer: Self-pay | Admitting: Obstetrics and Gynecology

## 2016-01-21 DIAGNOSIS — O99119 Other diseases of the blood and blood-forming organs and certain disorders involving the immune mechanism complicating pregnancy, unspecified trimester: Secondary | ICD-10-CM

## 2016-01-21 DIAGNOSIS — D696 Thrombocytopenia, unspecified: Secondary | ICD-10-CM | POA: Insufficient documentation

## 2016-02-19 ENCOUNTER — Ambulatory Visit (INDEPENDENT_AMBULATORY_CARE_PROVIDER_SITE_OTHER): Payer: BC Managed Care – PPO | Admitting: Obstetrics & Gynecology

## 2016-02-19 VITALS — BP 120/84 | HR 111 | Wt 165.0 lb

## 2016-02-19 DIAGNOSIS — O34219 Maternal care for unspecified type scar from previous cesarean delivery: Secondary | ICD-10-CM

## 2016-02-19 DIAGNOSIS — D696 Thrombocytopenia, unspecified: Secondary | ICD-10-CM

## 2016-02-19 DIAGNOSIS — O99113 Other diseases of the blood and blood-forming organs and certain disorders involving the immune mechanism complicating pregnancy, third trimester: Secondary | ICD-10-CM

## 2016-02-19 DIAGNOSIS — Z3483 Encounter for supervision of other normal pregnancy, third trimester: Secondary | ICD-10-CM

## 2016-02-19 NOTE — Progress Notes (Signed)
BRX Compliant  

## 2016-02-19 NOTE — Patient Instructions (Signed)
Trial of Labor After Cesarean Delivery Information A trial of labor after cesarean delivery (TOLAC) is when a woman tries to give birth vaginally after a previous cesarean delivery. TOLAC may be a safe and appropriate option for you depending on your medical history and other risk factors. When TOLAC is successful and you are able to have a vaginal delivery, this is called a vaginal birth after cesarean delivery (VBAC).  CANDIDATES FOR TOLAC TOLAC is possible for some women who:  Have undergone one or two prior cesarean deliveries in which the incision of the uterus was horizontal (low transverse).  Are carrying twins and have had one prior low transverse incision during a cesarean delivery.  Do not have a vertical (classical) uterine scar.  Have not had a tear in the wall of their uterus (uterine rupture). TOLAC is also supported for women who meet appropriate criteria and:  Are under the age of 40 years.  Are tall and have a body mass index (BMI) of less than 30.  Have an unknown uterine scar.  Give birth in a facility equipped to handle an emergency cesarean delivery. This team should be able to handle possible complications such as a uterine rupture.  Have thorough counseling about the benefits and risks of TOLAC.  Have discussed future pregnancy plans with their health care provider.  Plan to have several more pregnancies. MOST SUCCESSFUL CANDIDATES FOR TOLAC:  Have had a successful vaginal delivery before or after their cesarean delivery.  Experience labor that begins naturally on or before the due date (40 weeks of gestation).  Do not have a very large (macrosomic) baby.   Had a prior cesarean delivery but are not currently experiencing factors that would prompt a cesarean delivery (such as a breech position).  Had only one prior cesarean delivery.  Had a prior cesarean delivery that was performed early in labor and not after full cervical dilation. TOLAC may be most  appropriate for women who meet the above guidelines and who plan to have more pregnancies. TOLAC is not recommended for home births. LEAST SUCCESSFUL CANDIDATES FOR TOLAC:  Have an induced labor with an unfavorable cervix. An unfavorable cervix is when the cervix is not dilating enough (among other factors).  Have never had a vaginal delivery.  Have had more than two cesarean deliveries.  Have a pregnancy at more than 40 weeks of gestation.  Are pregnant with a baby with a suspected weight greater than 4,000 grams (8 pounds) and who have no prior history of a vaginal delivery.  Have closely spaced pregnancies. SUGGESTED BENEFITS OF TOLAC  You may have a faster recovery time.  You may have a shorter stay in the hospital.  You may have less pain and fewer problems than with a cesarean delivery. Women who have a cesarean delivery have a higher chance of needing blood or getting a fever, an infection, or a blood clot in the legs. SUGGESTED RISKS OF TOLAC The highest risk of complications happens to women who attempt a TOLAC and fail. A failed TOLAC results in an unplanned cesarean delivery. Risks related to TOLAC or repeat cesarean deliveries include:   Blood loss.  Infection.  Blood clot.  Injury to surrounding tissues or organs.  Having to remove the uterus (hysterectomy).  Potential problems with the placenta (such as placenta previa or placenta accreta) in future pregnancies. Although very rare, the main concerns with TOLAC are:  Rupture of the uterine scar from a past cesarean delivery.  Needing an   emergency cesarean delivery.  Having a bad outcome for the baby (perinatal morbidity). FOR MORE INFORMATION American Congress of Obstetricians and Gynecologists: www.acog.org American College of Nurse-Midwives: www.midwife.org   This information is not intended to replace advice given to you by your health care provider. Make sure you discuss any questions you have with  your health care provider.   Document Released: 03/11/2011 Document Revised: 04/13/2013 Document Reviewed: 12/13/2012 Elsevier Interactive Patient Education 2016 Elsevier Inc.  

## 2016-02-19 NOTE — Progress Notes (Signed)
Subjective:  [name] is a 35 y.o. G2P1001 at 6969w0d being seen today for ongoing prenatal care.  She is currently monitored for the following issues for this low-risk pregnancy and has Asthma affecting pregnancy, antepartum; Supervision of normal pregnancy, antepartum; Previous cesarean delivery x1 in 2005; and Gestational thrombocytopenia (HCC) on her problem list.  Patient reports no complaints.  Contractions: Irritability. Vag. Bleeding: None.  Movement: Present. Denies leaking of fluid.   The following portions of the patient's history were reviewed and updated as appropriate: allergies, current medications, past family history, past medical history, past social history, past surgical history and problem list. Problem list updated.  Objective:   Vitals:   02/19/16 0902  BP: 120/84  Pulse: (!) 111  Weight: 165 lb (74.8 kg)    Fetal Status: Fetal Heart Rate (bpm): 146 Fundal Height: 34 cm Movement: Present     General:  Alert, oriented and cooperative. Patient is in no acute distress.  Skin: Skin is warm and dry. No rash noted.   Cardiovascular: Normal heart rate noted  Respiratory: Normal respiratory effort, no problems with respiration noted  Abdomen: Soft, gravid, appropriate for gestational age. Pain/Pressure: Present     Pelvic:  Cervical exam deferred        Extremities: Normal range of motion.  Edema: None  Mental Status: Normal mood and affect. Normal behavior. Normal judgment and thought content.   Urinalysis: Urine Protein: Trace Urine Glucose: Negative  Assessment and Plan:  Pregnancy: G2P1001 at 3069w0d  1. Gestational thrombocytopenia, third trimester (HCC) 01/18/16 Platelet count was 110K.  Will recheck today.  If still low, may need Prednisone course (40-60 mg daily for 2 weeks) starting around 36-37 weeks.   - CBC  2. Previous cesarean delivery x1 in 2005 Counseled about TOLAC vs RCS; she is leaning towards TOLAC.  Consent signed.   3. Supervision of  normal pregnancy, antepartum, third trimester Follow up scan already scheduled for later this week. Preterm labor symptoms and general obstetric precautions including but not limited to vaginal bleeding, contractions, leaking of fluid and fetal movement were reviewed in detail with the patient. Please refer to After Visit Summary for other counseling recommendations.  Return in about 4 weeks (around 03/18/2016) for OB Visit, Pelvic cultures (36 week Babyscripts).   Lynn NewcomerUgonna A Svetlana Bagby, MD

## 2016-02-20 ENCOUNTER — Telehealth: Payer: Self-pay | Admitting: *Deleted

## 2016-02-20 LAB — CBC
HCT: 36.3 % (ref 35.0–45.0)
Hemoglobin: 12.1 g/dL (ref 11.7–15.5)
MCH: 31.8 pg (ref 27.0–33.0)
MCHC: 33.3 g/dL (ref 32.0–36.0)
MCV: 95.5 fL (ref 80.0–100.0)
MPV: 13.1 fL — AB (ref 7.5–12.5)
PLATELETS: 109 10*3/uL — AB (ref 140–400)
RBC: 3.8 MIL/uL (ref 3.80–5.10)
RDW: 13.6 % (ref 11.0–15.0)
WBC: 9 10*3/uL (ref 3.8–10.8)

## 2016-02-20 NOTE — Telephone Encounter (Signed)
Pt returned call, informed of platelet count and care plan, pt acknowledged instructions.

## 2016-02-20 NOTE — Telephone Encounter (Signed)
Called pt, no answer, left message to call the office.  

## 2016-02-20 NOTE — Telephone Encounter (Signed)
-----   Message from Ugonna A Anyanwu, MD sent at 02/20/2016 10:49 AM EDT ----- Platelet count is 109K, no need for intervention for now. Will recheck at next visit. If low then, will start Prednisone course. Please call to inform patient of results and recommendations.  

## 2016-02-20 NOTE — Telephone Encounter (Signed)
-----   Message from Tereso NewcomerUgonna A Anyanwu, MD sent at 02/20/2016 10:49 AM EDT ----- Platelet count is 109K, no need for intervention for now. Will recheck at next visit. If low then, will start Prednisone course. Please call to inform patient of results and recommendations.

## 2016-02-21 ENCOUNTER — Encounter (HOSPITAL_COMMUNITY): Payer: Self-pay

## 2016-02-21 ENCOUNTER — Ambulatory Visit (HOSPITAL_COMMUNITY)
Admission: RE | Admit: 2016-02-21 | Discharge: 2016-02-21 | Disposition: A | Discharge status: Home / Self Care | Payer: BC Managed Care – PPO | Source: Ambulatory Visit | Attending: Obstetrics and Gynecology | Admitting: Obstetrics and Gynecology

## 2016-02-21 ENCOUNTER — Other Ambulatory Visit: Payer: Self-pay | Admitting: Obstetrics and Gynecology

## 2016-02-21 DIAGNOSIS — O9989 Other specified diseases and conditions complicating pregnancy, childbirth and the puerperium: Secondary | ICD-10-CM | POA: Diagnosis not present

## 2016-02-21 DIAGNOSIS — O09523 Supervision of elderly multigravida, third trimester: Secondary | ICD-10-CM

## 2016-02-21 DIAGNOSIS — Z3A32 32 weeks gestation of pregnancy: Secondary | ICD-10-CM | POA: Diagnosis not present

## 2016-02-21 DIAGNOSIS — J45909 Unspecified asthma, uncomplicated: Secondary | ICD-10-CM

## 2016-02-21 DIAGNOSIS — Z3492 Encounter for supervision of normal pregnancy, unspecified, second trimester: Secondary | ICD-10-CM

## 2016-02-21 DIAGNOSIS — O34219 Maternal care for unspecified type scar from previous cesarean delivery: Secondary | ICD-10-CM

## 2016-02-21 DIAGNOSIS — O99519 Diseases of the respiratory system complicating pregnancy, unspecified trimester: Secondary | ICD-10-CM

## 2016-02-28 ENCOUNTER — Ambulatory Visit (HOSPITAL_COMMUNITY): Payer: Self-pay

## 2016-03-17 ENCOUNTER — Ambulatory Visit (INDEPENDENT_AMBULATORY_CARE_PROVIDER_SITE_OTHER): Payer: BC Managed Care – PPO | Admitting: Obstetrics and Gynecology

## 2016-03-17 VITALS — BP 133/89 | HR 106 | Wt 168.0 lb

## 2016-03-17 DIAGNOSIS — O99113 Other diseases of the blood and blood-forming organs and certain disorders involving the immune mechanism complicating pregnancy, third trimester: Secondary | ICD-10-CM | POA: Diagnosis not present

## 2016-03-17 DIAGNOSIS — Z113 Encounter for screening for infections with a predominantly sexual mode of transmission: Secondary | ICD-10-CM | POA: Diagnosis not present

## 2016-03-17 DIAGNOSIS — Z23 Encounter for immunization: Secondary | ICD-10-CM

## 2016-03-17 DIAGNOSIS — D696 Thrombocytopenia, unspecified: Secondary | ICD-10-CM | POA: Diagnosis not present

## 2016-03-17 DIAGNOSIS — O34219 Maternal care for unspecified type scar from previous cesarean delivery: Secondary | ICD-10-CM | POA: Diagnosis not present

## 2016-03-17 DIAGNOSIS — Z3483 Encounter for supervision of other normal pregnancy, third trimester: Secondary | ICD-10-CM

## 2016-03-17 LAB — OB RESULTS CONSOLE GBS: STREP GROUP B AG: NEGATIVE

## 2016-03-17 LAB — OB RESULTS CONSOLE GC/CHLAMYDIA: Gonorrhea: NEGATIVE

## 2016-03-17 NOTE — Progress Notes (Signed)
Prenatal Visit Note Date: 03/17/2016 Clinic: [facility]  Subjective:  [place] is a 35 y.o. G2P1001 at 7259w6d being seen today for ongoing prenatal care.  She is currently monitored for the following issues for this high-risk pregnancy and has Asthma affecting pregnancy, antepartum; Supervision of normal pregnancy, antepartum; Previous cesarean delivery x1 in 2005; and Gestational thrombocytopenia (HCC) on her problem list.  Patient reports no complaints.   Contractions: Irregular. Vag. Bleeding: None.  Movement: Present. Denies leaking of fluid.   The following portions of the patient's history were reviewed and updated as appropriate: allergies, current medications, past family history, past medical history, past social history, past surgical history and problem list. Problem list updated.  Objective:   Vitals:   03/17/16 1534  BP: 133/89  Pulse: (!) 106  Weight: 168 lb (76.2 kg)    Fetal Status: Fetal Heart Rate (bpm): 140s Fundal Height: 36 cm Movement: Present  Presentation: Vertex  General:  Alert, oriented and cooperative. Patient is in no acute distress.  Skin: Skin is warm and dry. No rash noted.   Cardiovascular: Normal heart rate noted  Respiratory: Normal respiratory effort, no problems with respiration noted  Abdomen: Soft, gravid, appropriate for gestational age. Pain/Pressure: Present     Pelvic:  FT/long/high (pt desired cx check)        Extremities: Normal range of motion.  Edema: None  Mental Status: Normal mood and affect. Normal behavior. Normal judgment and thought content.   Urinalysis: Urine Protein: Trace Urine Glucose: Negative  Assessment and Plan:  Pregnancy: G2P1001 at 4659w6d  1. Supervision of normal pregnancy, antepartum, third trimester Routine care - Flu Vaccine QUAD 36+ mos IM (Fluarix, Quad PF) - Culture, beta strep (group b only) - GC/Chlamydia probe amp ([place])[facility] at [facility]  2. Gestational  thrombocytopenia -f/u CBC from today and continue qweek; d/w pt that wouldn't recommend anything [facility] long as plts remain above 100k CBC Latest Ref Rng & Units 02/19/2016 01/18/2016 08/30/2015  WBC 3.8 - 10.8 K/uL 9.0 9.6 -  Hemoglobin 11.7 - 15.5 g/dL 08.612.1 11.6(L) 13.2  Hematocrit 35.0 - 45.0 % 36.3 34.6(L) 40  Platelets 140 - 400 K/uL 109(L) 110(L) 160   3. H/[facility]section [facility]. Consent already signed. D/w her re various options such as IOL vs TOLAC just with spont labor.   Preterm labor symptoms and general obstetric precautions including but not limited to vaginal bleeding, contractions, leaking of fluid and fetal movement were reviewed in detail with the patient. Please refer to After Visit Summary for other counseling recommendations.  Return in about 1 week (around 03/24/2016).   [place] Bingharlie Linkin Vizzini, MD

## 2016-03-18 LAB — CBC
HCT: 37.2 % (ref 35.0–45.0)
Hemoglobin: 12.7 g/dL (ref 11.7–15.5)
MCH: 31.9 pg (ref 27.0–33.0)
MCHC: 34.1 g/dL (ref 32.0–36.0)
MCV: 93.5 fL (ref 80.0–100.0)
PLATELETS: 103 10*3/uL — AB (ref 140–400)
RBC: 3.98 MIL/uL (ref 3.80–5.10)
RDW: 13.2 % (ref 11.0–15.0)
WBC: 13.5 10*3/uL — ABNORMAL HIGH (ref 3.8–10.8)

## 2016-03-18 NOTE — Progress Notes (Signed)
Patient has been given results.  °

## 2016-03-19 ENCOUNTER — Encounter (INDEPENDENT_AMBULATORY_CARE_PROVIDER_SITE_OTHER): Payer: BC Managed Care – PPO | Admitting: *Deleted

## 2016-03-19 DIAGNOSIS — Z3483 Encounter for supervision of other normal pregnancy, third trimester: Secondary | ICD-10-CM

## 2016-03-19 LAB — CULTURE, BETA STREP (GROUP B ONLY)

## 2016-03-19 LAB — GC/CHLAMYDIA PROBE AMP (~~LOC~~) NOT AT ARMC
Chlamydia: NEGATIVE
Neisseria Gonorrhea: NEGATIVE

## 2016-03-26 ENCOUNTER — Ambulatory Visit (INDEPENDENT_AMBULATORY_CARE_PROVIDER_SITE_OTHER): Payer: BC Managed Care – PPO | Admitting: Family Medicine

## 2016-03-26 VITALS — BP 122/87 | HR 97

## 2016-03-26 DIAGNOSIS — O09529 Supervision of elderly multigravida, unspecified trimester: Secondary | ICD-10-CM | POA: Insufficient documentation

## 2016-03-26 DIAGNOSIS — O34219 Maternal care for unspecified type scar from previous cesarean delivery: Secondary | ICD-10-CM

## 2016-03-26 DIAGNOSIS — O99113 Other diseases of the blood and blood-forming organs and certain disorders involving the immune mechanism complicating pregnancy, third trimester: Secondary | ICD-10-CM

## 2016-03-26 DIAGNOSIS — D696 Thrombocytopenia, unspecified: Secondary | ICD-10-CM

## 2016-03-26 DIAGNOSIS — Z3483 Encounter for supervision of other normal pregnancy, third trimester: Secondary | ICD-10-CM

## 2016-03-26 DIAGNOSIS — O09523 Supervision of elderly multigravida, third trimester: Secondary | ICD-10-CM

## 2016-03-26 NOTE — Patient Instructions (Signed)
Trial of Labor After Cesarean Delivery Information A trial of labor after cesarean delivery (TOLAC) is when a woman tries to give birth vaginally after a previous cesarean delivery. TOLAC may be a safe and appropriate option for you depending on your medical history and other risk factors. When TOLAC is successful and you are able to have a vaginal delivery, this is called a vaginal birth after cesarean delivery (VBAC).  CANDIDATES FOR TOLAC TOLAC is possible for some women who:  Have undergone one or two prior cesarean deliveries in which the incision of the uterus was horizontal (low transverse).  Are carrying twins and have had one prior low transverse incision during a cesarean delivery.  Do not have a vertical (classical) uterine scar.  Have not had a tear in the wall of their uterus (uterine rupture). TOLAC is also supported for women who meet appropriate criteria and:  Are under the age of 40 years.  Are tall and have a body mass index (BMI) of less than 30.  Have an unknown uterine scar.  Give birth in a facility equipped to handle an emergency cesarean delivery. This team should be able to handle possible complications such as a uterine rupture.  Have thorough counseling about the benefits and risks of TOLAC.  Have discussed future pregnancy plans with their health care provider.  Plan to have several more pregnancies. MOST SUCCESSFUL CANDIDATES FOR TOLAC:  Have had a successful vaginal delivery before or after their cesarean delivery.  Experience labor that begins naturally on or before the due date (40 weeks of gestation).  Do not have a very large (macrosomic) baby.   Had a prior cesarean delivery but are not currently experiencing factors that would prompt a cesarean delivery (such as a breech position).  Had only one prior cesarean delivery.  Had a prior cesarean delivery that was performed early in labor and not after full cervical dilation. TOLAC may be most  appropriate for women who meet the above guidelines and who plan to have more pregnancies. TOLAC is not recommended for home births. LEAST SUCCESSFUL CANDIDATES FOR TOLAC:  Have an induced labor with an unfavorable cervix. An unfavorable cervix is when the cervix is not dilating enough (among other factors).  Have never had a vaginal delivery.  Have had more than two cesarean deliveries.  Have a pregnancy at more than 40 weeks of gestation.  Are pregnant with a baby with a suspected weight greater than 4,000 grams (8 pounds) and who have no prior history of a vaginal delivery.  Have closely spaced pregnancies. SUGGESTED BENEFITS OF TOLAC  You may have a faster recovery time.  You may have a shorter stay in the hospital.  You may have less pain and fewer problems than with a cesarean delivery. Women who have a cesarean delivery have a higher chance of needing blood or getting a fever, an infection, or a blood clot in the legs. SUGGESTED RISKS OF TOLAC The highest risk of complications happens to women who attempt a TOLAC and fail. A failed TOLAC results in an unplanned cesarean delivery. Risks related to TOLAC or repeat cesarean deliveries include:   Blood loss.  Infection.  Blood clot.  Injury to surrounding tissues or organs.  Having to remove the uterus (hysterectomy).  Potential problems with the placenta (such as placenta previa or placenta accreta) in future pregnancies. Although very rare, the main concerns with TOLAC are:  Rupture of the uterine scar from a past cesarean delivery.  Needing an   emergency cesarean delivery.  Having a bad outcome for the baby (perinatal morbidity). FOR MORE INFORMATION American Congress of Obstetricians and Gynecologists: www.acog.org American College of Nurse-Midwives: www.midwife.org   This information is not intended to replace advice given to you by your health care provider. Make sure you discuss any questions you have with  your health care provider.   Document Released: 03/11/2011 Document Revised: 04/13/2013 Document Reviewed: 12/13/2012 Elsevier Interactive Patient Education 2016 Elsevier Inc.  

## 2016-03-26 NOTE — Progress Notes (Signed)
   PRENATAL VISIT NOTE  Subjective:  [name] is a 35 y.o. G2P1001 at 8970w1d being seen today for ongoing prenatal care.  She is currently monitored for the following issues for this low-risk pregnancy and has Asthma affecting pregnancy, antepartum; Supervision of normal pregnancy, antepartum; Previous cesarean delivery x1 in 2005; Gestational thrombocytopenia (HCC); and AMA (advanced maternal age) multigravida 35+ on her problem list.  Patient reports no complaints.  Contractions: Irregular. Vag. Bleeding: None.  Movement: Present. Denies leaking of fluid.   The following portions of the patient's history were reviewed and updated as appropriate: allergies, current medications, past family history, past medical history, past social history, past surgical history and problem list. Problem list updated.  Objective:   Vitals:   03/26/16 1546  BP: 122/87  Pulse: 97    Fetal Status:     Movement: Present     General:  Alert, oriented and cooperative. Patient is in no acute distress.  Skin: Skin is warm and dry. No rash noted.   Cardiovascular: Normal heart rate noted  Respiratory: Normal respiratory effort, no problems with respiration noted  Abdomen: Soft, gravid, appropriate for gestational age. Pain/Pressure: Present     Pelvic:  Cervical exam deferred        Extremities: Normal range of motion.  Edema: None  Mental Status: Normal mood and affect. Normal behavior. Normal judgment and thought content.   Urinalysis:      Assessment and Plan:  Pregnancy: G2P1001 at 2470w1d  1. Gestational thrombocytopenia, third trimester (HCC) Stable 103-109 x 3 months  2. Supervision of normal pregnancy, antepartum, third trimester UTD Reviewed natural ways to bring about labor Reviewed sweeping at 39 weeks  3. Previous cesarean delivery x1 in 2005 Desires TOLAC  4. AMA (advanced maternal age) multigravida 35+, third trimester   Term labor symptoms and general obstetric  precautions including but not limited to vaginal bleeding, contractions, leaking of fluid and fetal movement were reviewed in detail with the patient. Please refer to After Visit Summary for other counseling recommendations.  Return in about 1 week (around 04/02/2016) for Routine prenatal care.  Federico FlakeKimberly Niles Brunella Wileman, MD

## 2016-03-30 ENCOUNTER — Encounter (HOSPITAL_COMMUNITY): Payer: Self-pay | Admitting: *Deleted

## 2016-03-30 ENCOUNTER — Inpatient Hospital Stay (HOSPITAL_COMMUNITY)
Admission: AD | Admit: 2016-03-30 | Discharge: 2016-03-31 | Disposition: A | Discharge status: Home / Self Care | Payer: BC Managed Care – PPO | Source: Ambulatory Visit | Attending: Obstetrics & Gynecology | Admitting: Obstetrics & Gynecology

## 2016-03-30 DIAGNOSIS — J45909 Unspecified asthma, uncomplicated: Secondary | ICD-10-CM

## 2016-03-30 DIAGNOSIS — O99519 Diseases of the respiratory system complicating pregnancy, unspecified trimester: Secondary | ICD-10-CM

## 2016-03-30 DIAGNOSIS — O34219 Maternal care for unspecified type scar from previous cesarean delivery: Secondary | ICD-10-CM

## 2016-03-30 DIAGNOSIS — O26893 Other specified pregnancy related conditions, third trimester: Secondary | ICD-10-CM | POA: Insufficient documentation

## 2016-03-30 DIAGNOSIS — R030 Elevated blood-pressure reading, without diagnosis of hypertension: Secondary | ICD-10-CM | POA: Insufficient documentation

## 2016-03-30 DIAGNOSIS — IMO0001 Reserved for inherently not codable concepts without codable children: Secondary | ICD-10-CM

## 2016-03-30 DIAGNOSIS — Z3A37 37 weeks gestation of pregnancy: Secondary | ICD-10-CM | POA: Diagnosis not present

## 2016-03-30 LAB — CBC
HCT: 34.5 % — ABNORMAL LOW (ref 36.0–46.0)
Hemoglobin: 11.8 g/dL — ABNORMAL LOW (ref 12.0–15.0)
MCH: 31.6 pg (ref 26.0–34.0)
MCHC: 34.2 g/dL (ref 30.0–36.0)
MCV: 92.5 fL (ref 78.0–100.0)
PLATELETS: 96 10*3/uL — AB (ref 150–400)
RBC: 3.73 MIL/uL — ABNORMAL LOW (ref 3.87–5.11)
RDW: 13.3 % (ref 11.5–15.5)
WBC: 10.9 10*3/uL — AB (ref 4.0–10.5)

## 2016-03-30 LAB — COMPREHENSIVE METABOLIC PANEL
ALBUMIN: 2.9 g/dL — AB (ref 3.5–5.0)
ALT: 10 U/L — ABNORMAL LOW (ref 14–54)
AST: 17 U/L (ref 15–41)
Alkaline Phosphatase: 226 U/L — ABNORMAL HIGH (ref 38–126)
Anion gap: 7 (ref 5–15)
BUN: 6 mg/dL (ref 6–20)
CHLORIDE: 107 mmol/L (ref 101–111)
CO2: 20 mmol/L — ABNORMAL LOW (ref 22–32)
Calcium: 8.9 mg/dL (ref 8.9–10.3)
Creatinine, Ser: 0.5 mg/dL (ref 0.44–1.00)
GFR calc Af Amer: >60 mL/min (ref >60)
Glucose, Bld: 84 mg/dL (ref 65–99)
POTASSIUM: 3.4 mmol/L — AB (ref 3.5–5.1)
Sodium: 134 mmol/L — ABNORMAL LOW (ref 135–145)
Total Bilirubin: 0.4 mg/dL (ref 0.3–1.2)
Total Protein: 5.9 g/dL — ABNORMAL LOW (ref 6.5–8.1)

## 2016-03-30 LAB — PROTEIN / CREATININE RATIO, URINE: CREATININE, URINE: 23 mg/dL

## 2016-03-30 NOTE — MAU Note (Signed)
Pt reports her b/p at home was 140/90 and was told to come in. Currently not on meds. Denies blurred vision, headache or upper abd pain.

## 2016-03-30 NOTE — MAU Provider Note (Signed)
History     CSN: [id number]  Arrival date and time: 03/30/16 2150   None     No chief complaint on file.  [name] is a 35 y.o. G2P1001 at 5257w5d who was sent in by baby scripts for elevated BP. States was having regular check in with baby scripts today when was told her BP was elevated at 140/90 and she needed to come to the MAU. States has never had elevated BPs throughout this pregnancy. Denies HA, vision changes, midepigastric/RUQ pain, CP, SOB. Denies LOF or vaginal bleeding. Endorses good fetal movement. Has been having some irregular mild contractions over the last 2 weeks but none currently.     OB History    Gravida Para Term Preterm AB Living   2 1 1  0 0 1   SAB TAB Ectopic Multiple Live Births   0 0 0 0 1      Past Medical History:  Diagnosis Date  . Asthma   . Migraines     Past Surgical History:  Procedure Laterality Date  . CESAREAN SECTION  10.02/2004  . COLONOSCOPY     35 years old  . UPPER GI ENDOSCOPY     35 years old    Family History  Problem Relation Age of Onset  . Diabetes Mother   . Anuerysm Mother   . Mitral valve prolapse Mother   . Fibromyalgia Mother   . Cervical cancer Maternal Grandmother   . Cervical cancer Paternal Grandmother   . Breast cancer Maternal Aunt     Social History  Substance Use Topics  . Smoking status: Never Smoker  . Smokeless tobacco: Never Used  . Alcohol use No    Allergies:  Allergies  Allergen Reactions  . Septra [Sulfamethoxazole-Trimethoprim] Rash    Prescriptions Prior to Admission  Medication Sig Dispense Refill Last Dose  . albuterol (ACCUNEB) 1.25 MG/3ML nebulizer solution Take 1 ampule by nebulization every 6 (six) hours as needed for wheezing.   Past Month at Unknown time  . montelukast (SINGULAIR) 10 MG tablet Take 10 mg by mouth at bedtime.   03/30/2016 at Unknown time  . Prenatal Vit-Fe Fumarate-FA (MULTIVITAMIN-PRENATAL) 27-0.8 MG TABS tablet Take 1 tablet by mouth daily at 12  noon.   03/30/2016 at Unknown time  . VENTOLIN HFA 108 (90 Base) MCG/ACT inhaler INHALE 1 TO 2 PUFFS BY MOUTH EVERY 4 HOURS AS NEEDED  1 Past Month at Unknown time  . fexofenadine (ALLEGRA) 30 MG tablet Take 30 mg by mouth 2 (two) times daily.   More than a month at Unknown time    Review of Systems  Eyes: Negative for blurred vision, double vision and photophobia.  Respiratory: Negative for shortness of breath.   Cardiovascular: Negative for chest pain and palpitations.  Gastrointestinal: Negative for abdominal pain, heartburn, nausea and vomiting.  Genitourinary: Negative for dysuria, frequency, hematuria and urgency.  Musculoskeletal: Negative for back pain.  Neurological: Negative for headaches.   Physical Exam   Blood pressure 125/90, pulse 102, temperature 98.5 F (36.9 C), temperature source Oral, resp. rate 18, height 5\' 7"  (1.702 m), weight 76.7 kg (169 lb), last menstrual period 07/10/2015, SpO2 99 %.  Physical Exam  Constitutional: She is oriented to person, place, and time. She appears well-developed and well-nourished. No distress.  HENT:  Head: Normocephalic and atraumatic.  Eyes: Conjunctivae and EOM are normal. Pupils are equal, round, and reactive to light.  Cardiovascular: Normal rate, regular rhythm, normal heart sounds and intact distal  pulses.   No murmur heard. Respiratory: Effort normal and breath sounds normal. No respiratory distress. She has no wheezes.  GI: Soft. Bowel sounds are normal. There is no tenderness. There is no rebound and no guarding.  Musculoskeletal: Normal range of motion.  Neurological: She is alert and oriented to person, place, and time. She has normal reflexes.  Skin: Skin is warm and dry.  Psychiatric: She has a normal mood and affect.    MAU Course  Procedures  MDM  BP spontaneously improved to 123/82, patient remained asymptomatic. CBC showed Plts 96 with patient's baseline in low 100s. LFTs were not elevated and  protein/creatinine ratio was WNL. NST reactive.  Assessment and Plan  IUP@37 .6 Elevated BP, now resolved - patient to have close follow up with prenatal visit on 04/01/16 for BP check - given return precautions and labor precautions  Leland Her PGY-1 03/30/2016, 10:44 PM

## 2016-03-30 NOTE — MAU Note (Signed)
Urine sent to the lab

## 2016-03-31 NOTE — Discharge Instructions (Signed)

## 2016-04-01 ENCOUNTER — Ambulatory Visit (INDEPENDENT_AMBULATORY_CARE_PROVIDER_SITE_OTHER): Payer: BC Managed Care – PPO | Admitting: Obstetrics & Gynecology

## 2016-04-01 ENCOUNTER — Encounter: Payer: Self-pay | Admitting: *Deleted

## 2016-04-01 VITALS — BP 126/83 | HR 112 | Wt 170.0 lb

## 2016-04-01 DIAGNOSIS — Z3483 Encounter for supervision of other normal pregnancy, third trimester: Secondary | ICD-10-CM

## 2016-04-01 DIAGNOSIS — D696 Thrombocytopenia, unspecified: Secondary | ICD-10-CM

## 2016-04-01 DIAGNOSIS — O09523 Supervision of elderly multigravida, third trimester: Secondary | ICD-10-CM

## 2016-04-01 DIAGNOSIS — O99113 Other diseases of the blood and blood-forming organs and certain disorders involving the immune mechanism complicating pregnancy, third trimester: Secondary | ICD-10-CM

## 2016-04-01 DIAGNOSIS — O34219 Maternal care for unspecified type scar from previous cesarean delivery: Secondary | ICD-10-CM

## 2016-04-01 NOTE — Progress Notes (Signed)
   PRENATAL VISIT NOTE  Subjective:  [name] is a 35 y.o. G2P1001 at 1941w0d being seen today for ongoing prenatal care.  She is currently monitored for the following issues for this high-risk pregnancy and has Asthma affecting pregnancy, antepartum; Supervision of normal pregnancy, antepartum; Previous cesarean delivery x1 in 2005; Gestational thrombocytopenia (HCC); and AMA (advanced maternal age) multigravida 35+ on her problem list.  Patient reports no complaints.  Contractions: Irregular. Vag. Bleeding: None.  Movement: Present. Denies leaking of fluid.   The following portions of the patient's history were reviewed and updated as appropriate: allergies, current medications, past family history, past medical history, past social history, past surgical history and problem list. Problem list updated.  Objective:   Vitals:   04/01/16 1605  BP: 126/83  Pulse: (!) 112  Weight: 170 lb (77.1 kg)    Fetal Status: Fetal Heart Rate (bpm): 154   Movement: Present     General:  Alert, oriented and cooperative. Patient is in no acute distress.  Skin: Skin is warm and dry. No rash noted.   Cardiovascular: Normal heart rate noted  Respiratory: Normal respiratory effort, no problems with respiration noted  Abdomen: Soft, gravid, appropriate for gestational age. Pain/Pressure: Present     Pelvic:  Cervical exam performed        Extremities: Normal range of motion.  Edema: None  Mental Status: Normal mood and affect. Normal behavior. Normal judgment and thought content.   Urinalysis: Urine Protein: Trace Urine Glucose: Negative  Assessment and Plan:  Pregnancy: G2P1001 at 8641w0d  1. AMA (advanced maternal age) multigravida 35+, third trimester   2. Gestational thrombocytopenia, third trimester (HCC) - PLT 96K last weekend  3. Previous cesarean delivery x1 in 2005 - She is planning a TOLAC  4. Supervision of normal pregnancy, antepartum, third trimester   Term labor symptoms  and general obstetric precautions including but not limited to vaginal bleeding, contractions, leaking of fluid and fetal movement were reviewed in detail with the patient. Please refer to After Visit Summary for other counseling recommendations.  No Follow-up on file.  Allie BossierMyra C Sarah Zerby, MD

## 2016-04-07 ENCOUNTER — Ambulatory Visit (INDEPENDENT_AMBULATORY_CARE_PROVIDER_SITE_OTHER): Payer: BC Managed Care – PPO | Admitting: Obstetrics and Gynecology

## 2016-04-07 VITALS — BP 137/86 | HR 106 | Wt 172.0 lb

## 2016-04-07 DIAGNOSIS — D696 Thrombocytopenia, unspecified: Secondary | ICD-10-CM

## 2016-04-07 DIAGNOSIS — Z3480 Encounter for supervision of other normal pregnancy, unspecified trimester: Secondary | ICD-10-CM

## 2016-04-07 DIAGNOSIS — O99113 Other diseases of the blood and blood-forming organs and certain disorders involving the immune mechanism complicating pregnancy, third trimester: Secondary | ICD-10-CM

## 2016-04-07 DIAGNOSIS — O34219 Maternal care for unspecified type scar from previous cesarean delivery: Secondary | ICD-10-CM

## 2016-04-07 DIAGNOSIS — O09523 Supervision of elderly multigravida, third trimester: Secondary | ICD-10-CM

## 2016-04-07 NOTE — Progress Notes (Signed)
   PRENATAL VISIT NOTE  Subjective:  [name] is a 35 y.o. G2P1001 at 4437w6d being seen today for ongoing prenatal care.  She is currently monitored for the following issues for this high-risk pregnancy and has Asthma affecting pregnancy, antepartum; Supervision of normal pregnancy, antepartum; Previous cesarean delivery x1 in 2005; Gestational thrombocytopenia (HCC); and AMA (advanced maternal age) multigravida 35+ on her problem list.  Patient reports no complaints.  Contractions: Irregular. Vag. Bleeding: None.  Movement: Present. Denies leaking of fluid.   The following portions of the patient's history were reviewed and updated as appropriate: allergies, current medications, past family history, past medical history, past social history, past surgical history and problem list. Problem list updated.  Objective:   Vitals:   04/07/16 1339  BP: 137/87  Pulse: (!) 106  Weight: 172 lb (78 kg)    Fetal Status: Fetal Heart Rate (bpm): 147 Fundal Height: 40 cm Movement: Present  Presentation: Vertex  General:  Alert, oriented and cooperative. Patient is in no acute distress.  Skin: Skin is warm and dry. No rash noted.   Cardiovascular: Normal heart rate noted  Respiratory: Normal respiratory effort, no problems with respiration noted  Abdomen: Soft, gravid, appropriate for gestational age. Pain/Pressure: Present     Pelvic:  Cervical exam performed Dilation: 1 Effacement (%): 80 Station: -3  Extremities: Normal range of motion.  Edema: None  Mental Status: Normal mood and affect. Normal behavior. Normal judgment and thought content.   Urinalysis: Urine Protein: Negative Urine Glucose: Negative  Assessment and Plan:  Pregnancy: G2P1001 at 6237w6d  1. Supervision of other normal pregnancy, antepartum Patient is doing well Membranes stripped Postdate fetal testing discussed if past 40 weeks  2. Previous cesarean delivery x1 in 2005 Patient still desires TOLAC  3. Benign  gestational thrombocytopenia in third trimester (HCC)   4. Elderly multigravida in third trimester   Term labor symptoms and general obstetric precautions including but not limited to vaginal bleeding, contractions, leaking of fluid and fetal movement were reviewed in detail with the patient. Please refer to After Visit Summary for other counseling recommendations.  Return in about 1 week (around 04/14/2016).  Catalina AntiguaPeggy Olivia Pavelko, MD

## 2016-04-09 ENCOUNTER — Encounter: Payer: Self-pay | Admitting: Family Medicine

## 2016-04-15 ENCOUNTER — Ambulatory Visit (INDEPENDENT_AMBULATORY_CARE_PROVIDER_SITE_OTHER): Payer: BC Managed Care – PPO | Admitting: Family Medicine

## 2016-04-15 ENCOUNTER — Telehealth (HOSPITAL_COMMUNITY): Payer: Self-pay | Admitting: *Deleted

## 2016-04-15 VITALS — BP 131/86 | HR 109 | Wt 173.0 lb

## 2016-04-15 DIAGNOSIS — O480 Post-term pregnancy: Secondary | ICD-10-CM

## 2016-04-15 NOTE — Patient Instructions (Signed)
Third Trimester of Pregnancy The third trimester is from week 29 through week 42, months 7 through 9. The third trimester is a time when the fetus is growing rapidly. At the end of the ninth month, the fetus is about 20 inches in length and weighs 6-10 pounds.  BODY CHANGES Your body goes through many changes during pregnancy. The changes vary from woman to woman.   Your weight will continue to increase. You can expect to gain 25-35 pounds (11-16 kg) by the end of the pregnancy.  You may begin to get stretch marks on your hips, abdomen, and breasts.  You may urinate more often because the fetus is moving lower into your pelvis and pressing on your bladder.  You may develop or continue to have heartburn as a result of your pregnancy.  You may develop constipation because certain hormones are causing the muscles that push waste through your intestines to slow down.  You may develop hemorrhoids or swollen, bulging veins (varicose veins).  You may have pelvic pain because of the weight gain and pregnancy hormones relaxing your joints between the bones in your pelvis. Backaches may result from overexertion of the muscles supporting your posture.  You may have changes in your hair. These can include thickening of your hair, rapid growth, and changes in texture. Some women also have hair loss during or after pregnancy, or hair that feels dry or thin. Your hair will most likely return to normal after your baby is born.  Your breasts will continue to grow and be tender. A yellow discharge may leak from your breasts called colostrum.  Your belly button may stick out.  You may feel short of breath because of your expanding uterus.  You may notice the fetus "dropping," or moving lower in your abdomen.  You may have a bloody mucus discharge. This usually occurs a few days to a week before labor begins.  Your cervix becomes thin and soft (effaced) near your due date. WHAT TO EXPECT AT YOUR  PRENATAL EXAMS  You will have prenatal exams every 2 weeks until week 36. Then, you will have weekly prenatal exams. During a routine prenatal visit:  You will be weighed to make sure you and the fetus are growing normally.  Your blood pressure is taken.  Your abdomen will be measured to track your baby's growth.  The fetal heartbeat will be listened to.  Any test results from the previous visit will be discussed.  You may have a cervical check near your due date to see if you have effaced. At around 36 weeks, your caregiver will check your cervix. At the same time, your caregiver will also perform a test on the secretions of the vaginal tissue. This test is to determine if a type of bacteria, Group B streptococcus, is present. Your caregiver will explain this further. Your caregiver may ask you:  What your birth plan is.  How you are feeling.  If you are feeling the baby move.  If you have had any abnormal symptoms, such as leaking fluid, bleeding, severe headaches, or abdominal cramping.  If you are using any tobacco products, including cigarettes, chewing tobacco, and electronic cigarettes.  If you have any questions. Other tests or screenings that may be performed during your third trimester include:  Blood tests that check for low iron levels (anemia).  Fetal testing to check the health, activity level, and growth of the fetus. Testing is done if you have certain medical conditions or if   there are problems during the pregnancy.  HIV (human immunodeficiency virus) testing. If you are at high risk, you may be screened for HIV during your third trimester of pregnancy. FALSE LABOR You may feel small, irregular contractions that eventually go away. These are called Braxton Hicks contractions, or false labor. Contractions may last for hours, days, or even weeks before true labor sets in. If contractions come at regular intervals, intensify, or become painful, it is best to be seen  by your caregiver.  SIGNS OF LABOR   Menstrual-like cramps.  Contractions that are 5 minutes apart or less.  Contractions that start on the top of the uterus and spread down to the lower abdomen and back.  A sense of increased pelvic pressure or back pain.  A watery or bloody mucus discharge that comes from the vagina. If you have any of these signs before the 37th week of pregnancy, call your caregiver right away. You need to go to the hospital to get checked immediately. HOME CARE INSTRUCTIONS   Avoid all smoking, herbs, alcohol, and unprescribed drugs. These chemicals affect the formation and growth of the baby.  Do not use any tobacco products, including cigarettes, chewing tobacco, and electronic cigarettes. If you need help quitting, ask your health care provider. You may receive counseling support and other resources to help you quit.  Follow your caregiver's instructions regarding medicine use. There are medicines that are either safe or unsafe to take during pregnancy.  Exercise only as directed by your caregiver. Experiencing uterine cramps is a good sign to stop exercising.  Continue to eat regular, healthy meals.  Wear a good support bra for breast tenderness.  Do not use hot tubs, steam rooms, or saunas.  Wear your seat belt at all times when driving.  Avoid raw meat, uncooked cheese, cat litter boxes, and soil used by cats. These carry germs that can cause birth defects in the baby.  Take your prenatal vitamins.  Take 1500-2000 mg of calcium daily starting at the 20th week of pregnancy until you deliver your baby.  Try taking a stool softener (if your caregiver approves) if you develop constipation. Eat more high-fiber foods, such as fresh vegetables or fruit and whole grains. Drink plenty of fluids to keep your urine clear or pale yellow.  Take warm sitz baths to soothe any pain or discomfort caused by hemorrhoids. Use hemorrhoid cream if your caregiver  approves.  If you develop varicose veins, wear support hose. Elevate your feet for 15 minutes, 3-4 times a day. Limit salt in your diet.  Avoid heavy lifting, wear low heal shoes, and practice good posture.  Rest a lot with your legs elevated if you have leg cramps or low back pain.  Visit your dentist if you have not gone during your pregnancy. Use a soft toothbrush to brush your teeth and be gentle when you floss.  A sexual relationship may be continued unless your caregiver directs you otherwise.  Do not travel far distances unless it is absolutely necessary and only with the approval of your caregiver.  Take prenatal classes to understand, practice, and ask questions about the labor and delivery.  Make a trial run to the hospital.  Pack your hospital bag.  Prepare the baby's nursery.  Continue to go to all your prenatal visits as directed by your caregiver. SEEK MEDICAL CARE IF:  You are unsure if you are in labor or if your water has broken.  You have dizziness.  You have   mild pelvic cramps, pelvic pressure, or nagging pain in your abdominal area.  You have persistent nausea, vomiting, or diarrhea.  You have a bad smelling vaginal discharge.  You have pain with urination. SEEK IMMEDIATE MEDICAL CARE IF:   You have a fever.  You are leaking fluid from your vagina.  You have spotting or bleeding from your vagina.  You have severe abdominal cramping or pain.  You have rapid weight loss or gain.  You have shortness of breath with chest pain.  You notice sudden or extreme swelling of your face, hands, ankles, feet, or legs.  You have not felt your baby move in over an hour.  You have severe headaches that do not go away with medicine.  You have vision changes.   This information is not intended to replace advice given to you by your health care provider. Make sure you discuss any questions you have with your health care provider.   Document Released:  06/17/2001 Document Revised: 07/14/2014 Document Reviewed: 08/24/2012 Elsevier Interactive Patient Education 2016 [employer]  Breastfeeding Deciding to breastfeed is one of the best choices you can make for you and your baby. A change in hormones during pregnancy causes your breast tissue to grow and increases the number and size of your milk ducts. These hormones also allow proteins, sugars, and fats from your blood supply to make breast milk in your milk-producing glands. Hormones prevent breast milk from being released before your baby is born as well as prompt milk flow after birth. Once breastfeeding has begun, thoughts of your baby, as well as his or her sucking or crying, can stimulate the release of milk from your milk-producing glands.  BENEFITS OF BREASTFEEDING For Your Baby  Your first milk (colostrum) helps your baby's digestive system function better.  There are antibodies in your milk that help your baby fight off infections.  Your baby has a lower incidence of asthma, allergies, and sudden infant death syndrome.  The nutrients in breast milk are better for your baby than infant formulas and are designed uniquely for your baby's needs.  Breast milk improves your baby's brain development.  Your baby is less likely to develop other conditions, such as childhood obesity, asthma, or type 2 diabetes mellitus. For You  Breastfeeding helps to create a very special bond between you and your baby.  Breastfeeding is convenient. Breast milk is always available at the correct temperature and costs nothing.  Breastfeeding helps to burn calories and helps you lose the weight gained during pregnancy.  Breastfeeding makes your uterus contract to its prepregnancy size faster and slows bleeding (lochia) after you give birth.   Breastfeeding helps to lower your risk of developing type 2 diabetes mellitus, osteoporosis, and breast or ovarian cancer later in life. SIGNS THAT YOUR BABY IS  HUNGRY Early Signs of Hunger  Increased alertness or activity.  Stretching.  Movement of the head from side to side.  Movement of the head and opening of the mouth when the corner of the mouth or cheek is stroked (rooting).  Increased sucking sounds, smacking lips, cooing, sighing, or squeaking.  Hand-to-mouth movements.  Increased sucking of fingers or hands. Late Signs of Hunger  Fussing.  Intermittent crying. Extreme Signs of Hunger Signs of extreme hunger will require calming and consoling before your baby will be able to breastfeed successfully. Do not wait for the following signs of extreme hunger to occur before you initiate breastfeeding:  Restlessness.  A loud, strong cry.  Screaming.   BREASTFEEDING BASICS Breastfeeding Initiation  Find a comfortable place to sit or lie down, with your neck and back well supported.  Place a pillow or rolled up blanket under your baby to bring him or her to the level of your breast (if you are seated). Nursing pillows are specially designed to help support your arms and your baby while you breastfeed.  Make sure that your baby's abdomen is facing your abdomen.  Gently massage your breast. With your fingertips, massage from your chest wall toward your nipple in a circular motion. This encourages milk flow. You may need to continue this action during the feeding if your milk flows slowly.  Support your breast with 4 fingers underneath and your thumb above your nipple. Make sure your fingers are well away from your nipple and your baby's mouth.  Stroke your baby's lips gently with your finger or nipple.  When your baby's mouth is open wide enough, quickly bring your baby to your breast, placing your entire nipple and as much of the colored area around your nipple (areola) as possible into your baby's mouth.  More areola should be visible above your baby's upper lip than below the lower lip.  Your baby's tongue should be between his  or her lower gum and your breast.  Ensure that your baby's mouth is correctly positioned around your nipple (latched). Your baby's lips should create a seal on your breast and be turned out (everted).  It is common for your baby to suck about 2-3 minutes in order to start the flow of breast milk. Latching Teaching your baby how to latch on to your breast properly is very important. An improper latch can cause nipple pain and decreased milk supply for you and poor weight gain in your baby. Also, if your baby is not latched onto your nipple properly, he or she may swallow some air during feeding. This can make your baby fussy. Burping your baby when you switch breasts during the feeding can help to get rid of the air. However, teaching your baby to latch on properly is still the best way to prevent fussiness from swallowing air while breastfeeding. Signs that your baby has successfully latched on to your nipple:  Silent tugging or silent sucking, without causing you pain.  Swallowing heard between every 3-4 sucks.  Muscle movement above and in front of his or her ears while sucking. Signs that your baby has not successfully latched on to nipple:  Sucking sounds or smacking sounds from your baby while breastfeeding.  Nipple pain. If you think your baby has not latched on correctly, slip your finger into the corner of your baby's mouth to break the suction and place it between your baby's gums. Attempt breastfeeding initiation again. Signs of Successful Breastfeeding Signs from your baby:  A gradual decrease in the number of sucks or complete cessation of sucking.  Falling asleep.  Relaxation of his or her body.  Retention of a small amount of milk in his or her mouth.  Letting go of your breast by himself or herself. Signs from you:  Breasts that have increased in firmness, weight, and size 1-3 hours after feeding.  Breasts that are softer immediately after  breastfeeding.  Increased milk volume, as well as a change in milk consistency and color by the fifth day of breastfeeding.  Nipples that are not sore, cracked, or bleeding. Signs That Your Baby is Getting Enough Milk  Wetting at least 3 diapers in a 24-hour period.   The urine should be clear and pale yellow by age 5 days.  At least 3 stools in a 24-hour period by age 5 days. The stool should be soft and yellow.  At least 3 stools in a 24-hour period by age 7 days. The stool should be seedy and yellow.  No loss of weight greater than 10% of birth weight during the first 3 days of age.  Average weight gain of 4-7 ounces (113-198 g) per week after age 4 days.  Consistent daily weight gain by age 5 days, without weight loss after the age of 2 weeks. After a feeding, your baby may spit up a small amount. This is common. BREASTFEEDING FREQUENCY AND DURATION Frequent feeding will help you make more milk and can prevent sore nipples and breast engorgement. Breastfeed when you feel the need to reduce the fullness of your breasts or when your baby shows signs of hunger. This is called "breastfeeding on demand." Avoid introducing a pacifier to your baby while you are working to establish breastfeeding (the first 4-6 weeks after your baby is born). After this time you may choose to use a pacifier. Research has shown that pacifier use during the first year of a baby's life decreases the risk of sudden infant death syndrome (SIDS). Allow your baby to feed on each breast as long as he or she wants. Breastfeed until your baby is finished feeding. When your baby unlatches or falls asleep while feeding from the first breast, offer the second breast. Because newborns are often sleepy in the first few weeks of life, you may need to awaken your baby to get him or her to feed. Breastfeeding times will vary from baby to baby. However, the following rules can serve as a guide to help you ensure that your baby is  properly fed:  Newborns (babies 4 weeks of age or younger) may breastfeed every 1-3 hours.  Newborns should not go longer than 3 hours during the day or 5 hours during the night without breastfeeding.  You should breastfeed your baby a minimum of 8 times in a 24-hour period until you begin to introduce solid foods to your baby at around 6 months of age. BREAST MILK PUMPING Pumping and storing breast milk allows you to ensure that your baby is exclusively fed your breast milk, even at times when you are unable to breastfeed. This is especially important if you are going back to work while you are still breastfeeding or when you are not able to be present during feedings. Your lactation consultant can give you guidelines on how long it is safe to store breast milk. A breast pump is a machine that allows you to pump milk from your breast into a sterile bottle. The pumped breast milk can then be stored in a refrigerator or freezer. Some breast pumps are operated by hand, while others use electricity. Ask your lactation consultant which type will work best for you. Breast pumps can be purchased, but some hospitals and breastfeeding support groups lease breast pumps on a monthly basis. A lactation consultant can teach you how to hand express breast milk, if you prefer not to use a pump. CARING FOR YOUR BREASTS WHILE YOU BREASTFEED Nipples can become dry, cracked, and sore while breastfeeding. The following recommendations can help keep your breasts moisturized and healthy:  Avoid using soap on your nipples.  Wear a supportive bra. Although not required, special nursing bras and tank tops are designed to allow access to your   breasts for breastfeeding without taking off your entire bra or top. Avoid wearing underwire-style bras or extremely tight bras.  Air dry your nipples for 3-4minutes after each feeding.  Use only cotton bra pads to absorb leaked breast milk. Leaking of breast milk between feedings  is normal.  Use lanolin on your nipples after breastfeeding. Lanolin helps to maintain your skin's normal moisture barrier. If you use pure lanolin, you do not need to wash it off before feeding your baby again. Pure lanolin is not toxic to your baby. You may also hand express a few drops of breast milk and gently massage that milk into your nipples and allow the milk to air dry. In the first few weeks after giving birth, some women experience extremely full breasts (engorgement). Engorgement can make your breasts feel heavy, warm, and tender to the touch. Engorgement peaks within 3-5 days after you give birth. The following recommendations can help ease engorgement:  Completely empty your breasts while breastfeeding or pumping. You may want to start by applying warm, moist heat (in the shower or with warm water-soaked hand towels) just before feeding or pumping. This increases circulation and helps the milk flow. If your baby does not completely empty your breasts while breastfeeding, pump any extra milk after he or she is finished.  Wear a snug bra (nursing or regular) or tank top for 1-2 days to signal your body to slightly decrease milk production.  Apply ice packs to your breasts, unless this is too uncomfortable for you.  Make sure that your baby is latched on and positioned properly while breastfeeding. If engorgement persists after 48 hours of following these recommendations, contact your health care provider or a lactation consultant. OVERALL HEALTH CARE RECOMMENDATIONS WHILE BREASTFEEDING  Eat healthy foods. Alternate between meals and snacks, eating 3 of each per day. Because what you eat affects your breast milk, some of the foods may make your baby more irritable than usual. Avoid eating these foods if you are sure that they are negatively affecting your baby.  Drink milk, fruit juice, and water to satisfy your thirst (about 10 glasses a day).  Rest often, relax, and continue to take  your prenatal vitamins to prevent fatigue, stress, and anemia.  Continue breast self-awareness checks.  Avoid chewing and smoking tobacco. Chemicals from cigarettes that pass into breast milk and exposure to secondhand smoke may harm your baby.  Avoid alcohol and drug use, including marijuana. Some medicines that may be harmful to your baby can pass through breast milk. It is important to ask your health care provider before taking any medicine, including all over-the-counter and prescription medicine as well as vitamin and herbal supplements. It is possible to become pregnant while breastfeeding. If birth control is desired, ask your health care provider about options that will be safe for your baby. SEEK MEDICAL CARE IF:  You feel like you want to stop breastfeeding or have become frustrated with breastfeeding.  You have painful breasts or nipples.  Your nipples are cracked or bleeding.  Your breasts are red, tender, or warm.  You have a swollen area on either breast.  You have a fever or chills.  You have nausea or vomiting.  You have drainage other than breast milk from your nipples.  Your breasts do not become full before feedings by the fifth day after you give birth.  You feel sad and depressed.  Your baby is too sleepy to eat well.  Your baby is having trouble sleeping.     Your baby is wetting less than 3 diapers in a 24-hour period.  Your baby has less than 3 stools in a 24-hour period.  Your baby's skin or the white part of his or her eyes becomes yellow.   Your baby is not gaining weight by 5 days of age. SEEK IMMEDIATE MEDICAL CARE IF:  Your baby is overly tired (lethargic) and does not want to wake up and feed.  Your baby develops an unexplained fever.   This information is not intended to replace advice given to you by your health care provider. Make sure you discuss any questions you have with your health care provider.   Document Released: 06/23/2005  Document Revised: 03/14/2015 Document Reviewed: 12/15/2012 Elsevier Interactive Patient Education 2016 [employer]  

## 2016-04-15 NOTE — Telephone Encounter (Signed)
Preadmission screen  

## 2016-04-15 NOTE — Progress Notes (Addendum)
   PRENATAL VISIT NOTE  Subjective:  [name] is a 35 y.o. G2P1001 at 2246w0d being seen today for ongoing prenatal care.  She is currently monitored for the following issues for this low-risk pregnancy and has Asthma affecting pregnancy, antepartum; Supervision of normal pregnancy, antepartum; Previous cesarean delivery x1 in 2005; Gestational thrombocytopenia (HCC); and AMA (advanced maternal age) multigravida 35+ on her problem list.  Patient reports no complaints.  Contractions: Irregular. Vag. Bleeding: None.  Movement: Present. Denies leaking of fluid.   The following portions of the patient's history were reviewed and updated as appropriate: allergies, current medications, past family history, past medical history, past social history, past surgical history and problem list. Problem list updated.  Objective:   Vitals:   04/15/16 0928  BP: 131/86  Pulse: (!) 109  Weight: 173 lb (78.5 kg)    Fetal Status: Fetal Heart Rate (bpm): 158 Fundal Height: 39 cm Movement: Present  Presentation: Vertex  General:  Alert, oriented and cooperative. Patient is in no acute distress.  Skin: Skin is warm and dry. No rash noted.   Cardiovascular: Normal heart rate noted  Respiratory: Normal respiratory effort, no problems with respiration noted  Abdomen: Soft, gravid, appropriate for gestational age. Pain/Pressure: Absent     Pelvic:  Cervical exam performed Dilation: 1 Effacement (%): 90 Station: -3  Extremities: Normal range of motion.  Edema: None  Mental Status: Normal mood and affect. Normal behavior. Normal judgment and thought content.   NST reviewed and reactive.   Assessment and Plan:  Pregnancy: G2P1001 at 2246w0d  1. Post-term pregnancy, 40-42 weeks of gestation For 2x/wk testing and IOL at 7240 wks--still wants TOLAC - Fetal nonstress test  Term labor symptoms and general obstetric precautions including but not limited to vaginal bleeding, contractions, leaking of fluid and  fetal movement were reviewed in detail with the patient. Please refer to After Visit Summary for other counseling recommendations.  Return in 6 weeks (on 05/27/2016) for pp check, NST + AFI in 4 days.  Reva Boresanya S Faduma Cho, MD

## 2016-04-18 ENCOUNTER — Ambulatory Visit (INDEPENDENT_AMBULATORY_CARE_PROVIDER_SITE_OTHER): Payer: BC Managed Care – PPO | Admitting: *Deleted

## 2016-04-18 VITALS — BP 121/87 | HR 97 | Wt 174.0 lb

## 2016-04-18 DIAGNOSIS — O480 Post-term pregnancy: Secondary | ICD-10-CM | POA: Diagnosis not present

## 2016-04-21 ENCOUNTER — Encounter (HOSPITAL_COMMUNITY): Payer: Self-pay | Admitting: Certified Nurse Midwife

## 2016-04-21 ENCOUNTER — Inpatient Hospital Stay (HOSPITAL_COMMUNITY): Payer: BC Managed Care – PPO | Admitting: Anesthesiology

## 2016-04-21 ENCOUNTER — Inpatient Hospital Stay (HOSPITAL_COMMUNITY)
Admission: AD | Admit: 2016-04-21 | Discharge: 2016-04-25 | DRG: 765 | Disposition: A | Discharge status: Home / Self Care | Payer: BC Managed Care – PPO | Source: Ambulatory Visit | Attending: Family Medicine | Admitting: Family Medicine

## 2016-04-21 DIAGNOSIS — D62 Acute posthemorrhagic anemia: Secondary | ICD-10-CM | POA: Diagnosis not present

## 2016-04-21 DIAGNOSIS — Z3A41 41 weeks gestation of pregnancy: Secondary | ICD-10-CM | POA: Diagnosis not present

## 2016-04-21 DIAGNOSIS — O4202 Full-term premature rupture of membranes, onset of labor within 24 hours of rupture: Secondary | ICD-10-CM | POA: Diagnosis not present

## 2016-04-21 DIAGNOSIS — O4292 Full-term premature rupture of membranes, unspecified as to length of time between rupture and onset of labor: Secondary | ICD-10-CM | POA: Diagnosis present

## 2016-04-21 DIAGNOSIS — O9952 Diseases of the respiratory system complicating childbirth: Secondary | ICD-10-CM | POA: Diagnosis present

## 2016-04-21 DIAGNOSIS — O9081 Anemia of the puerperium: Secondary | ICD-10-CM | POA: Diagnosis not present

## 2016-04-21 DIAGNOSIS — O09523 Supervision of elderly multigravida, third trimester: Secondary | ICD-10-CM

## 2016-04-21 DIAGNOSIS — O34211 Maternal care for low transverse scar from previous cesarean delivery: Secondary | ICD-10-CM | POA: Diagnosis present

## 2016-04-21 DIAGNOSIS — O34219 Maternal care for unspecified type scar from previous cesarean delivery: Secondary | ICD-10-CM

## 2016-04-21 DIAGNOSIS — D6959 Other secondary thrombocytopenia: Secondary | ICD-10-CM | POA: Diagnosis present

## 2016-04-21 DIAGNOSIS — Z3A40 40 weeks gestation of pregnancy: Secondary | ICD-10-CM | POA: Diagnosis not present

## 2016-04-21 DIAGNOSIS — O9912 Other diseases of the blood and blood-forming organs and certain disorders involving the immune mechanism complicating childbirth: Secondary | ICD-10-CM | POA: Diagnosis present

## 2016-04-21 DIAGNOSIS — Z833 Family history of diabetes mellitus: Secondary | ICD-10-CM

## 2016-04-21 DIAGNOSIS — J45909 Unspecified asthma, uncomplicated: Secondary | ICD-10-CM | POA: Diagnosis present

## 2016-04-21 DIAGNOSIS — O99519 Diseases of the respiratory system complicating pregnancy, unspecified trimester: Secondary | ICD-10-CM

## 2016-04-21 LAB — CBC
HEMATOCRIT: 37.2 % (ref 36.0–46.0)
Hemoglobin: 12.9 g/dL (ref 12.0–15.0)
MCH: 32.1 pg (ref 26.0–34.0)
MCHC: 34.7 g/dL (ref 30.0–36.0)
MCV: 92.5 fL (ref 78.0–100.0)
PLATELETS: 115 10*3/uL — AB (ref 150–400)
RBC: 4.02 MIL/uL (ref 3.87–5.11)
RDW: 14 % (ref 11.5–15.5)
WBC: 15.1 10*3/uL — AB (ref 4.0–10.5)

## 2016-04-21 LAB — ABO/RH: ABO/RH(D): O POS

## 2016-04-21 LAB — LACTATE DEHYDROGENASE: LDH: 179 U/L (ref 98–192)

## 2016-04-21 LAB — COMPREHENSIVE METABOLIC PANEL
ALT: 11 U/L — AB (ref 14–54)
AST: 22 U/L (ref 15–41)
Albumin: 2.8 g/dL — ABNORMAL LOW (ref 3.5–5.0)
Alkaline Phosphatase: 267 U/L — ABNORMAL HIGH (ref 38–126)
Anion gap: 11 (ref 5–15)
BILIRUBIN TOTAL: 0.7 mg/dL (ref 0.3–1.2)
BUN: 8 mg/dL (ref 6–20)
CHLORIDE: 102 mmol/L (ref 101–111)
CO2: 20 mmol/L — ABNORMAL LOW (ref 22–32)
CREATININE: 0.74 mg/dL (ref 0.44–1.00)
Calcium: 8.6 mg/dL — ABNORMAL LOW (ref 8.9–10.3)
GFR calc Af Amer: >60 mL/min (ref >60)
Glucose, Bld: 73 mg/dL (ref 65–99)
Potassium: 3.3 mmol/L — ABNORMAL LOW (ref 3.5–5.1)
Sodium: 133 mmol/L — ABNORMAL LOW (ref 135–145)
Total Protein: 6.2 g/dL — ABNORMAL LOW (ref 6.5–8.1)

## 2016-04-21 LAB — PROTEIN / CREATININE RATIO, URINE
Creatinine, Urine: 45 mg/dL
PROTEIN CREATININE RATIO: 0.24 mg/mg{creat} — AB (ref 0.00–0.15)
Total Protein, Urine: 11 mg/dL

## 2016-04-21 LAB — TYPE AND SCREEN
ABO/RH(D): O POS
ANTIBODY SCREEN: NEGATIVE

## 2016-04-21 LAB — RPR: RPR Ser Ql: NONREACTIVE

## 2016-04-21 LAB — URIC ACID: Uric Acid, Serum: 6 mg/dL (ref 2.3–6.6)

## 2016-04-21 MED ORDER — OXYTOCIN 40 UNITS IN LACTATED RINGERS INFUSION - SIMPLE MED: 2.5000 [IU]/h | INTRAVENOUS | Status: DC

## 2016-04-21 MED ORDER — OXYTOCIN BOLUS FROM INFUSION: 500.0000 mL | Freq: Once | INTRAVENOUS | Status: DC

## 2016-04-21 MED ORDER — PHENYLEPHRINE 40 MCG/ML (10ML) SYRINGE FOR IV PUSH (FOR BLOOD PRESSURE SUPPORT)
PREFILLED_SYRINGE | INTRAVENOUS | Status: AC
  Filled 2016-04-21: qty 20

## 2016-04-21 MED ORDER — LACTATED RINGERS IV SOLN
500.0000 mL | Freq: Once | INTRAVENOUS | Status: AC
  Administered 2016-04-21: 500 mL via INTRAVENOUS

## 2016-04-21 MED ORDER — EPHEDRINE 5 MG/ML INJ: 10.0000 mg | INTRAVENOUS | Status: DC | PRN

## 2016-04-21 MED ORDER — PHENYLEPHRINE 40 MCG/ML (10ML) SYRINGE FOR IV PUSH (FOR BLOOD PRESSURE SUPPORT): 80.0000 ug | PREFILLED_SYRINGE | INTRAVENOUS | Status: DC | PRN

## 2016-04-21 MED ORDER — OXYCODONE-ACETAMINOPHEN 5-325 MG PO TABS: 2.0000 | ORAL_TABLET | ORAL | Status: DC | PRN

## 2016-04-21 MED ORDER — SOD CITRATE-CITRIC ACID 500-334 MG/5ML PO SOLN
30.0000 mL | ORAL | Status: DC | PRN
  Administered 2016-04-22: 30 mL via ORAL
  Filled 2016-04-21: qty 15

## 2016-04-21 MED ORDER — FENTANYL 2.5 MCG/ML BUPIVACAINE 1/10 % EPIDURAL INFUSION (WH - ANES)
14.0000 mL/h | INTRAMUSCULAR | Status: DC | PRN
  Administered 2016-04-21 – 2016-04-22 (×4): 14 mL/h via EPIDURAL
  Filled 2016-04-21 (×3): qty 125

## 2016-04-21 MED ORDER — LACTATED RINGERS IV SOLN
500.0000 mL | INTRAVENOUS | Status: DC | PRN
  Administered 2016-04-21: 500 mL via INTRAVENOUS

## 2016-04-21 MED ORDER — OXYCODONE-ACETAMINOPHEN 5-325 MG PO TABS: 1.0000 | ORAL_TABLET | ORAL | Status: DC | PRN

## 2016-04-21 MED ORDER — OXYTOCIN 40 UNITS IN LACTATED RINGERS INFUSION - SIMPLE MED
1.0000 m[IU]/min | INTRAVENOUS | Status: DC
  Administered 2016-04-21: 2 m[IU]/min via INTRAVENOUS
  Filled 2016-04-21: qty 1000

## 2016-04-21 MED ORDER — FENTANYL 2.5 MCG/ML BUPIVACAINE 1/10 % EPIDURAL INFUSION (WH - ANES)
INTRAMUSCULAR | Status: AC
  Filled 2016-04-21: qty 125

## 2016-04-21 MED ORDER — DIPHENHYDRAMINE HCL 50 MG/ML IJ SOLN: 12.5000 mg | INTRAMUSCULAR | Status: DC | PRN

## 2016-04-21 MED ORDER — ONDANSETRON HCL 4 MG/2ML IJ SOLN: 4.0000 mg | Freq: Four times a day (QID) | INTRAMUSCULAR | Status: DC | PRN

## 2016-04-21 MED ORDER — LACTATED RINGERS IV SOLN
INTRAVENOUS | Status: DC
  Administered 2016-04-21 – 2016-04-22 (×7): via INTRAVENOUS

## 2016-04-21 MED ORDER — FLEET ENEMA 7-19 GM/118ML RE ENEM
1.0000 | ENEMA | RECTAL | Status: DC | PRN
Start: 2016-04-21 — End: 2016-04-22

## 2016-04-21 MED ORDER — ACETAMINOPHEN 325 MG PO TABS
650.0000 mg | ORAL_TABLET | ORAL | Status: DC | PRN
  Administered 2016-04-21 – 2016-04-22 (×3): 650 mg via ORAL
  Filled 2016-04-21 (×3): qty 2

## 2016-04-21 MED ORDER — LIDOCAINE HCL (PF) 1 % IJ SOLN: 30.0000 mL | INTRAMUSCULAR | Status: DC | PRN

## 2016-04-21 MED ORDER — TERBUTALINE SULFATE 1 MG/ML IJ SOLN: 0.2500 mg | Freq: Once | INTRAMUSCULAR | Status: DC | PRN

## 2016-04-21 MED ORDER — LIDOCAINE HCL (PF) 1 % IJ SOLN
INTRAMUSCULAR | Status: DC | PRN
  Administered 2016-04-21: 4 mL via EPIDURAL
  Administered 2016-04-21: 6 mL via EPIDURAL

## 2016-04-21 NOTE — Progress Notes (Signed)
Patient ID: Darreld McleanVirginia S Coykendall, female   DOB: 08-Feb-1981, 35 y.o.   MRN: [id number] Comfortable with epidural  Vitals:   04/21/16 1031 04/21/16 1201 04/21/16 1230 04/21/16 1231  BP: 122/64 137/89 (!) 148/90 (!) 148/90  Pulse: 95 (!) 108 (!) 114 (!) 114  Resp: 18 18    Temp:  98.2 F (36.8 C)    TempSrc:  Oral    SpO2:      Weight:      Height:       FHR stable and reassuring  Irregular contractions  Dilation: 4 Effacement (%): 80 Cervical Position: Posterior Station: -1 Presentation: Vertex Exam by:: Mayford KnifeWilliams, CNM  Membranes swept  Will observe for increased labor If not, may need to augment with Pitocin

## 2016-04-21 NOTE — H&P (Signed)
[name] is a 35 y.o. female G2P1 @ 40.6 wks presenting for early labor and SROM on admit to MAU. She is prev c/s for arrest of dilatation and desires TOL. OB History    Gravida Para Term Preterm AB Living   2 1 1  0 0 1   SAB TAB Ectopic Multiple Live Births   0 0 0 0 1     Past Medical History:  Diagnosis Date  . Asthma   . Migraines    Past Surgical History:  Procedure Laterality Date  . CESAREAN SECTION  10.02/2004  . COLONOSCOPY     35 years old  . UPPER GI ENDOSCOPY     35 years old   Family History: family history includes Anuerysm in her mother; Breast cancer in her maternal aunt; Cervical cancer in her maternal grandmother and paternal grandmother; Diabetes in her mother; Fibromyalgia in her mother; Mitral valve prolapse in her mother. Social History:  reports that she has never smoked. She has never used smokeless tobacco. She reports that she does not drink alcohol or use drugs.     Maternal Diabetes: No Genetic Screening: Normal Maternal Ultrasounds/Referrals: Normal Fetal Ultrasounds or other Referrals:  None Maternal Substance Abuse:  No Significant Maternal Medications:  None Significant Maternal Lab Results:  None Other Comments:  None  Review of Systems  Constitutional: Negative.   HENT: Negative.   Eyes: Negative.   Respiratory: Negative.   Cardiovascular: Negative.   Gastrointestinal: Positive for abdominal pain.  Genitourinary: Negative.   Musculoskeletal: Negative.   Skin: Negative.   Neurological: Negative.   Endo/Heme/Allergies: Negative.   Psychiatric/Behavioral: Negative.    Maternal Medical History:  Reason for admission: Contractions.   Contractions: Onset was 6-12 hours ago.   Frequency: regular.   Perceived severity is mild.    Fetal activity: Perceived fetal activity is normal.   Last perceived fetal movement was within the past hour.    Prenatal complications: no prenatal complications Prenatal Complications - Diabetes:  none.    Dilation: 3 Effacement (%): 90 Station: -3 Exam by:: V Rogers RN  Blood pressure 138/96, pulse 110, temperature 97.9 F (36.6 C), temperature source Oral, resp. rate 18, last menstrual period 07/10/2015, SpO2 98 %. Maternal Exam:  Uterine Assessment: Contraction strength is moderate.  Contraction frequency is regular.   Abdomen: Patient reports no abdominal tenderness. Surgical scars: low transverse.   Fetal presentation: vertex  Introitus: Normal vulva. Normal vagina.  Ferning test: not done.  Nitrazine test: not done. Amniotic fluid character: clear.  Pelvis: adequate for delivery.   Cervix: Cervix evaluated by digital exam.     Fetal Exam Fetal Monitor Review: Mode: ultrasound.   Variability: moderate (6-25 bpm).   Pattern: accelerations present.    Fetal State Assessment: Category I - tracings are normal.     Physical Exam  Constitutional: She is oriented to person, place, and time. She appears well-developed and well-nourished.  HENT:  Head: Normocephalic.  Eyes: Pupils are equal, round, and reactive to light.  Neck: Normal range of motion.  Cardiovascular: Normal rate, regular rhythm, normal heart sounds and intact distal pulses.   Respiratory: Effort normal.  GI: Soft.  Genitourinary: Vagina normal and uterus normal.  Musculoskeletal: Normal range of motion.  Neurological: She is alert and oriented to person, place, and time. She has normal reflexes.  Skin: Skin is warm and dry.  Psychiatric: She has a normal mood and affect. Her behavior is normal. Judgment and thought content normal.  Prenatal labs: ABO, Rh: O/Positive/-- (02/23 0000) Antibody: Negative (02/23 0000) Rubella: Immune, Nonimmune, Immune (02/23 0000) RPR: NON REAC (07/14 0947)  HBsAg: Negative (02/23 0000)  HIV: NONREACTIVE (07/14 0947)  GBS:     Assessment/Plan: Early labor with SROM. GBS neg Admit anticipate  Vag delivery   [name] 04/21/2016, 6:00 AM

## 2016-04-21 NOTE — Anesthesia Pain Management Evaluation Note (Signed)
  CRNA Pain Management Visit Note  Patient: Lynn McleanVirginia S Alwin, 35 y.o., female  "Hello I am a [facility] of the anesthesia [facility]. We have an anesthesia team available at all times to provide care throughout the hospital, including epidural management and anesthesia for C-section. I don't know your plan for the delivery whether it a natural birth, water birth, IV sedation, nitrous supplementation, doula or epidural, but we want to meet your pain goals."   1.Was your pain managed to your expectations on prior hospitalizations?   Yes   2.What is your expectation for pain management during this hospitalization?     Epidural  3.How can we help you reach that goal?   Record the patient's initial score and the patient's pain goal.   Pain: 3  Pain Goal: 6 [facility] wants you to be able to say your pain was always managed very well.  Laban EmperorMalinova,Rashaud Ybarbo Hristova 04/21/2016

## 2016-04-21 NOTE — Progress Notes (Signed)
Patient ID: Lynn McleanVirginia S Espinal, female   DOB: 01-21-1981, 35 y.o.   MRN: [id number]  Asked by RN to confirm vertex by US prior to restarting Pitocin  Vertex confirmed by US.  Fetal spine to maternal left FHR 150s

## 2016-04-21 NOTE — Anesthesia Preprocedure Evaluation (Signed)
Anesthesia Evaluation  Patient identified by MRN, date of birth, ID band Patient awake    Reviewed: Allergy & Precautions, NPO status , Patient's Chart, lab work & pertinent test results  History of Anesthesia Complications Negative for: history of anesthetic complications  Airway Mallampati: III  TM Distance: >3 FB Neck ROM: Full    Dental  (+) Teeth Intact   Pulmonary asthma ,    Pulmonary exam normal breath sounds clear to auscultation       Cardiovascular negative cardio ROS   Rhythm:Regular Rate:Normal     Neuro/Psych  Headaches,    GI/Hepatic negative GI ROS, Neg liver ROS,   Endo/Other  negative endocrine ROS  Renal/GU negative Renal ROS     Musculoskeletal   Abdominal   Peds  Hematology negative hematology ROS (+)   Anesthesia Other Findings Previous c-section under GA  Reproductive/Obstetrics (+) Pregnancy                             Anesthesia Physical Anesthesia Plan  ASA: II  Anesthesia Plan: Epidural   Post-op Pain Management:    Induction:   Airway Management Planned: Natural Airway  Additional Equipment:   Intra-op Plan:   Post-operative Plan:   Informed Consent: I have reviewed the patients History and Physical, chart, labs and discussed the procedure including the risks, benefits and alternatives for the proposed anesthesia with the patient or authorized representative who has indicated his/her understanding and acceptance.     Plan Discussed with:   Anesthesia Plan Comments: (I have discussed risks of neuraxial anesthesia including but not limited to infection, bleeding, nerve injury, back pain, headache, seizures, and failure of block. Patient denies bleeding disorders and is not currently anticoagulated. Labs have been reviewed. Risks and benefits discussed. All patient's questions answered.   Platelets 115)        Anesthesia Quick  Evaluation

## 2016-04-21 NOTE — Progress Notes (Signed)
Patient ID: [name], female   DOB: September 14, 1980, 35 y.o.   MRN: [id number] Comfortable with epidural  AVSS FHR stable UCs irregular  Cervix exam deferred for now due to SROM  Will recheck at noon

## 2016-04-21 NOTE — Progress Notes (Signed)
NST Note Date: 04/18/16 Gestational Age: 36/3 FHT: 135 baseline, +accels, no decel, mod variability Toco: occasional UCs  A/P: rNST. Continue with current plan of care  Cornelia Copaharlie Tyannah Sane, Jr MD Attending Center for [facility](Faculty Practice)

## 2016-04-21 NOTE — Anesthesia Procedure Notes (Signed)
Epidural Patient location during procedure: OB Start time: 04/21/2016 6:55 AM End time: 04/21/2016 7:00 AM  Staffing Anesthesiologist: Linton RumpALLAN, Kobie Matkins DICKERSON Performed: anesthesiologist   Preanesthetic Checklist Completed: patient identified, surgical consent, pre-op evaluation, timeout performed, IV checked, risks and benefits discussed and monitors and equipment checked  Epidural Patient position: sitting Prep: site prepped and draped and DuraPrep Patient monitoring: continuous pulse ox and blood pressure Approach: midline Location: L2-L3 Injection technique: LOR air  Needle:  Needle type: Tuohy  Needle gauge: 17 G Needle length: 9 cm and 9 Needle insertion depth: 4 cm Catheter type: closed end flexible Catheter size: 19 Gauge Catheter at skin depth: 9 cm Test dose: negative  Assessment Events: blood not aspirated, injection not painful, no injection resistance, negative IV test and no paresthesia  Additional Notes Epidural placed easily on first attempt.Reason for block:procedure for pain

## 2016-04-22 ENCOUNTER — Encounter (HOSPITAL_COMMUNITY): Payer: Self-pay

## 2016-04-22 ENCOUNTER — Encounter (HOSPITAL_COMMUNITY)
Admission: AD | Disposition: A | Discharge status: Home / Self Care | Payer: Self-pay | Source: Ambulatory Visit | Attending: Family Medicine

## 2016-04-22 ENCOUNTER — Inpatient Hospital Stay (HOSPITAL_COMMUNITY): Admission: RE | Admit: 2016-04-22 | Payer: BC Managed Care – PPO | Source: Ambulatory Visit

## 2016-04-22 DIAGNOSIS — Z3A41 41 weeks gestation of pregnancy: Secondary | ICD-10-CM

## 2016-04-22 DIAGNOSIS — O4202 Full-term premature rupture of membranes, onset of labor within 24 hours of rupture: Secondary | ICD-10-CM

## 2016-04-22 LAB — CBC
HEMATOCRIT: 26.8 % — AB (ref 36.0–46.0)
HEMOGLOBIN: 9.3 g/dL — AB (ref 12.0–15.0)
MCH: 32 pg (ref 26.0–34.0)
MCHC: 34.7 g/dL (ref 30.0–36.0)
MCV: 92.1 fL (ref 78.0–100.0)
Platelets: 95 10*3/uL — ABNORMAL LOW (ref 150–400)
RBC: 2.91 MIL/uL — AB (ref 3.87–5.11)
RDW: 14.2 % (ref 11.5–15.5)
WBC: 21.9 10*3/uL — ABNORMAL HIGH (ref 4.0–10.5)

## 2016-04-22 SURGERY — Surgical Case
Anesthesia: Epidural

## 2016-04-22 MED ORDER — SCOPOLAMINE 1 MG/3DAYS TD PT72
1.0000 | MEDICATED_PATCH | Freq: Once | TRANSDERMAL | Status: DC
  Filled 2016-04-22: qty 1

## 2016-04-22 MED ORDER — MEPERIDINE HCL 25 MG/ML IJ SOLN: 6.2500 mg | INTRAMUSCULAR | Status: DC | PRN

## 2016-04-22 MED ORDER — MEPERIDINE HCL 25 MG/ML IJ SOLN
INTRAMUSCULAR | Status: DC | PRN
  Administered 2016-04-22 (×2): 12.5 mg via INTRAVENOUS

## 2016-04-22 MED ORDER — NALBUPHINE HCL 10 MG/ML IJ SOLN: 5.0000 mg | Freq: Once | INTRAMUSCULAR | Status: DC | PRN

## 2016-04-22 MED ORDER — NALBUPHINE HCL 10 MG/ML IJ SOLN: 5.0000 mg | INTRAMUSCULAR | Status: DC | PRN

## 2016-04-22 MED ORDER — SODIUM BICARBONATE 8.4 % IV SOLN
INTRAVENOUS | Status: AC
  Filled 2016-04-22: qty 50

## 2016-04-22 MED ORDER — CEFAZOLIN SODIUM-DEXTROSE 2-4 GM/100ML-% IV SOLN
INTRAVENOUS | Status: AC
  Filled 2016-04-22: qty 100

## 2016-04-22 MED ORDER — OXYTOCIN 10 UNIT/ML IJ SOLN
INTRAMUSCULAR | Status: AC
  Filled 2016-04-22: qty 4

## 2016-04-22 MED ORDER — LIDOCAINE-EPINEPHRINE (PF) 2 %-1:200000 IJ SOLN
INTRAMUSCULAR | Status: AC
  Filled 2016-04-22: qty 20

## 2016-04-22 MED ORDER — FENTANYL CITRATE (PF) 100 MCG/2ML IJ SOLN: 25.0000 ug | INTRAMUSCULAR | Status: DC | PRN

## 2016-04-22 MED ORDER — NALOXONE HCL 0.4 MG/ML IJ SOLN: 0.4000 mg | INTRAMUSCULAR | Status: DC | PRN

## 2016-04-22 MED ORDER — WITCH HAZEL-GLYCERIN EX PADS
1.0000 "application " | MEDICATED_PAD | CUTANEOUS | Status: DC | PRN
  Administered 2016-04-24: 1 via TOPICAL

## 2016-04-22 MED ORDER — KETOROLAC TROMETHAMINE 30 MG/ML IJ SOLN
30.0000 mg | Freq: Four times a day (QID) | INTRAMUSCULAR | Status: AC | PRN
  Administered 2016-04-22: 30 mg via INTRAVENOUS
  Filled 2016-04-22: qty 1

## 2016-04-22 MED ORDER — OXYTOCIN 40 UNITS IN LACTATED RINGERS INFUSION - SIMPLE MED: 2.5000 [IU]/h | INTRAVENOUS | Status: AC

## 2016-04-22 MED ORDER — MENTHOL 3 MG MT LOZG: 1.0000 | LOZENGE | OROMUCOSAL | Status: DC | PRN

## 2016-04-22 MED ORDER — KETOROLAC TROMETHAMINE 30 MG/ML IJ SOLN: 30.0000 mg | Freq: Four times a day (QID) | INTRAMUSCULAR | Status: AC | PRN

## 2016-04-22 MED ORDER — ACETAMINOPHEN 325 MG PO TABS
650.0000 mg | ORAL_TABLET | ORAL | Status: DC | PRN
  Administered 2016-04-22: 650 mg via ORAL
  Filled 2016-04-22 (×2): qty 2

## 2016-04-22 MED ORDER — MEPERIDINE HCL 25 MG/ML IJ SOLN
INTRAMUSCULAR | Status: AC
  Filled 2016-04-22: qty 1

## 2016-04-22 MED ORDER — METHYLERGONOVINE MALEATE 0.2 MG/ML IJ SOLN
INTRAMUSCULAR | Status: DC | PRN
  Administered 2016-04-22: 0.2 mg via INTRAMUSCULAR

## 2016-04-22 MED ORDER — PRENATAL MULTIVITAMIN CH
1.0000 | ORAL_TABLET | Freq: Every day | ORAL | Status: DC
  Administered 2016-04-23 – 2016-04-25 (×3): 1 via ORAL
  Filled 2016-04-22 (×2): qty 1

## 2016-04-22 MED ORDER — MORPHINE SULFATE (PF) 0.5 MG/ML IJ SOLN
INTRAMUSCULAR | Status: DC | PRN
  Administered 2016-04-22: 1 mg via INTRAVENOUS
  Administered 2016-04-22: 4 mg via EPIDURAL

## 2016-04-22 MED ORDER — CEFAZOLIN SODIUM-DEXTROSE 2-3 GM-% IV SOLR
INTRAVENOUS | Status: DC | PRN
  Administered 2016-04-22: 2 g via INTRAVENOUS

## 2016-04-22 MED ORDER — SIMETHICONE 80 MG PO CHEW: 80.0000 mg | CHEWABLE_TABLET | ORAL | Status: DC | PRN

## 2016-04-22 MED ORDER — ALBUTEROL SULFATE (2.5 MG/3ML) 0.083% IN NEBU: 3.0000 mL | INHALATION_SOLUTION | RESPIRATORY_TRACT | Status: DC | PRN

## 2016-04-22 MED ORDER — SODIUM CHLORIDE 0.9 % IR SOLN
Status: DC | PRN
  Administered 2016-04-22: 1

## 2016-04-22 MED ORDER — METOCLOPRAMIDE HCL 5 MG/ML IJ SOLN
10.0000 mg | Freq: Once | INTRAMUSCULAR | Status: DC | PRN
Start: 2016-04-22 — End: 2016-04-22

## 2016-04-22 MED ORDER — DIPHENHYDRAMINE HCL 25 MG PO CAPS: 25.0000 mg | ORAL_CAPSULE | ORAL | Status: DC | PRN

## 2016-04-22 MED ORDER — LACTATED RINGERS IV SOLN: INTRAVENOUS | Status: DC

## 2016-04-22 MED ORDER — SODIUM BICARBONATE 8.4 % IV SOLN
INTRAVENOUS | Status: DC | PRN
  Administered 2016-04-22 (×6): 5 mL via EPIDURAL

## 2016-04-22 MED ORDER — STERILE WATER FOR IRRIGATION IR SOLN
Status: DC | PRN
Start: 2016-04-22 — End: 2016-04-22
  Administered 2016-04-22: 700 mL via INTRAVESICAL
  Administered 2016-04-22: 300 mL via INTRAVESICAL

## 2016-04-22 MED ORDER — SCOPOLAMINE 1 MG/3DAYS TD PT72
MEDICATED_PATCH | TRANSDERMAL | Status: DC | PRN
  Administered 2016-04-22: 1 via TRANSDERMAL

## 2016-04-22 MED ORDER — SIMETHICONE 80 MG PO CHEW
80.0000 mg | CHEWABLE_TABLET | Freq: Three times a day (TID) | ORAL | Status: DC
  Administered 2016-04-23 – 2016-04-25 (×9): 80 mg via ORAL
  Filled 2016-04-22 (×8): qty 1

## 2016-04-22 MED ORDER — MIDAZOLAM HCL 2 MG/2ML IJ SOLN
INTRAMUSCULAR | Status: DC | PRN
  Administered 2016-04-22: 1 mg via INTRAVENOUS

## 2016-04-22 MED ORDER — METHYLERGONOVINE MALEATE 0.2 MG/ML IJ SOLN
INTRAMUSCULAR | Status: AC
  Filled 2016-04-22: qty 1

## 2016-04-22 MED ORDER — DEXAMETHASONE SODIUM PHOSPHATE 4 MG/ML IJ SOLN
INTRAMUSCULAR | Status: AC
  Filled 2016-04-22: qty 1

## 2016-04-22 MED ORDER — OXYCODONE-ACETAMINOPHEN 5-325 MG PO TABS
2.0000 | ORAL_TABLET | ORAL | Status: DC | PRN
  Administered 2016-04-23 – 2016-04-25 (×9): 2 via ORAL
  Filled 2016-04-22 (×9): qty 2

## 2016-04-22 MED ORDER — LACTATED RINGERS IV SOLN
INTRAVENOUS | Status: DC
  Administered 2016-04-22: 12:00:00 via INTRAVENOUS

## 2016-04-22 MED ORDER — ONDANSETRON HCL 4 MG/2ML IJ SOLN
INTRAMUSCULAR | Status: DC | PRN
  Administered 2016-04-22: 4 mg via INTRAVENOUS

## 2016-04-22 MED ORDER — MEPERIDINE HCL 25 MG/ML IJ SOLN
6.2500 mg | INTRAMUSCULAR | Status: DC | PRN
  Administered 2016-04-22: 6.25 mg via INTRAVENOUS

## 2016-04-22 MED ORDER — TETANUS-DIPHTH-ACELL PERTUSSIS 5-2.5-18.5 LF-MCG/0.5 IM SUSP: 0.5000 mL | Freq: Once | INTRAMUSCULAR | Status: DC

## 2016-04-22 MED ORDER — SCOPOLAMINE 1 MG/3DAYS TD PT72
MEDICATED_PATCH | TRANSDERMAL | Status: AC
  Filled 2016-04-22: qty 1

## 2016-04-22 MED ORDER — COCONUT OIL OIL
1.0000 "application " | TOPICAL_OIL | Status: DC | PRN
  Administered 2016-04-23: 1 via TOPICAL
  Filled 2016-04-22: qty 120

## 2016-04-22 MED ORDER — DIPHENHYDRAMINE HCL 50 MG/ML IJ SOLN: 12.5000 mg | INTRAMUSCULAR | Status: DC | PRN

## 2016-04-22 MED ORDER — ONDANSETRON HCL 4 MG/2ML IJ SOLN: 4.0000 mg | Freq: Three times a day (TID) | INTRAMUSCULAR | Status: DC | PRN

## 2016-04-22 MED ORDER — MORPHINE SULFATE (PF) 0.5 MG/ML IJ SOLN
INTRAMUSCULAR | Status: AC
  Filled 2016-04-22: qty 10

## 2016-04-22 MED ORDER — DIBUCAINE 1 % RE OINT: 1.0000 "application " | TOPICAL_OINTMENT | RECTAL | Status: DC | PRN

## 2016-04-22 MED ORDER — MIDAZOLAM HCL 2 MG/2ML IJ SOLN
INTRAMUSCULAR | Status: AC
Start: 2016-04-22 — End: 2016-04-22
  Filled 2016-04-22: qty 2

## 2016-04-22 MED ORDER — SODIUM CHLORIDE 0.9% FLUSH: 3.0000 mL | INTRAVENOUS | Status: DC | PRN

## 2016-04-22 MED ORDER — SIMETHICONE 80 MG PO CHEW
80.0000 mg | CHEWABLE_TABLET | ORAL | Status: DC
  Administered 2016-04-23 – 2016-04-24 (×3): 80 mg via ORAL
  Filled 2016-04-22 (×3): qty 1

## 2016-04-22 MED ORDER — SENNOSIDES-DOCUSATE SODIUM 8.6-50 MG PO TABS
2.0000 | ORAL_TABLET | ORAL | Status: DC
Start: 2016-04-23 — End: 2016-04-25
  Administered 2016-04-22 – 2016-04-24 (×3): 2 via ORAL
  Filled 2016-04-22 (×3): qty 2

## 2016-04-22 MED ORDER — MONTELUKAST SODIUM 10 MG PO TABS
10.0000 mg | ORAL_TABLET | Freq: Every day | ORAL | Status: DC
  Administered 2016-04-22 – 2016-04-25 (×3): 10 mg via ORAL
  Filled 2016-04-22 (×4): qty 1

## 2016-04-22 MED ORDER — IBUPROFEN 600 MG PO TABS
600.0000 mg | ORAL_TABLET | Freq: Four times a day (QID) | ORAL | Status: DC
  Administered 2016-04-22 (×2): 600 mg via ORAL
  Filled 2016-04-22 (×2): qty 1

## 2016-04-22 MED ORDER — ZOLPIDEM TARTRATE 5 MG PO TABS: 5.0000 mg | ORAL_TABLET | Freq: Every evening | ORAL | Status: DC | PRN

## 2016-04-22 MED ORDER — DIPHENHYDRAMINE HCL 25 MG PO CAPS: 25.0000 mg | ORAL_CAPSULE | Freq: Four times a day (QID) | ORAL | Status: DC | PRN

## 2016-04-22 MED ORDER — ONDANSETRON HCL 4 MG/2ML IJ SOLN
INTRAMUSCULAR | Status: AC
  Filled 2016-04-22: qty 4

## 2016-04-22 MED ORDER — NALOXONE HCL 2 MG/2ML IJ SOSY
1.0000 ug/kg/h | PREFILLED_SYRINGE | INTRAVENOUS | Status: DC | PRN
  Filled 2016-04-22: qty 2

## 2016-04-22 MED ORDER — OXYTOCIN 10 UNIT/ML IJ SOLN
INTRAVENOUS | Status: DC | PRN
  Administered 2016-04-22: 40 [IU] via INTRAVENOUS

## 2016-04-22 MED ORDER — OXYCODONE-ACETAMINOPHEN 5-325 MG PO TABS
1.0000 | ORAL_TABLET | ORAL | Status: DC | PRN
  Administered 2016-04-22 – 2016-04-25 (×5): 1 via ORAL
  Filled 2016-04-22 (×5): qty 1

## 2016-04-22 SURGICAL SUPPLY — 35 items
BENZOIN TINCTURE PRP APPL 2/3 (GAUZE/BANDAGES/DRESSINGS) ×3 IMPLANT
CHLORAPREP W/TINT 26ML (MISCELLANEOUS) ×3 IMPLANT
CLAMP CORD UMBIL (MISCELLANEOUS) IMPLANT
CLOTH BEACON ORANGE TIMEOUT ST (SAFETY) ×3 IMPLANT
DRSG OPSITE POSTOP 4X10 (GAUZE/BANDAGES/DRESSINGS) ×3 IMPLANT
ELECT REM PT RETURN 9FT ADLT (ELECTROSURGICAL) ×3
ELECTRODE REM PT RTRN 9FT ADLT (ELECTROSURGICAL) ×2 IMPLANT
EXTRACTOR VACUUM M CUP 4 TUBE (SUCTIONS) IMPLANT
GLOVE BIOGEL PI IND STRL 7.0 (GLOVE) ×6 IMPLANT
GLOVE BIOGEL PI IND STRL 7.5 (GLOVE) ×4 IMPLANT
GLOVE BIOGEL PI INDICATOR 7.0 (GLOVE) ×3
GLOVE BIOGEL PI INDICATOR 7.5 (GLOVE) ×2
GLOVE ECLIPSE 7.5 STRL STRAW (GLOVE) ×9 IMPLANT
GOWN STRL REUS W/TWL LRG LVL3 (GOWN DISPOSABLE) ×9 IMPLANT
HEMOSTAT ARISTA ABSORB 3G PWDR (MISCELLANEOUS) ×3 IMPLANT
KIT ABG SYR 3ML LUER SLIP (SYRINGE) IMPLANT
NEEDLE HYPO 25X5/8 SAFETYGLIDE (NEEDLE) IMPLANT
NS IRRIG 1000ML POUR BTL (IV SOLUTION) ×6 IMPLANT
PACK C SECTION WH (CUSTOM PROCEDURE TRAY) ×3 IMPLANT
PAD ABD 7.5X8 STRL (GAUZE/BANDAGES/DRESSINGS) ×3 IMPLANT
PAD OB MATERNITY 4.3X12.25 (PERSONAL CARE ITEMS) ×3 IMPLANT
PENCIL SMOKE EVAC W/HOLSTER (ELECTROSURGICAL) ×3 IMPLANT
RTRCTR C-SECT PINK 25CM LRG (MISCELLANEOUS) ×3 IMPLANT
SPONGE DRAIN TRACH 4X4 STRL 2S (GAUZE/BANDAGES/DRESSINGS) ×3 IMPLANT
STRIP CLOSURE SKIN 1/2X4 (GAUZE/BANDAGES/DRESSINGS) ×3 IMPLANT
SUT VIC AB 0 CT1 36 (SUTURE) ×3 IMPLANT
SUT VIC AB 0 CTX 36 (SUTURE) ×3
SUT VIC AB 0 CTX36XBRD ANBCTRL (SUTURE) ×6 IMPLANT
SUT VIC AB 2-0 CT1 27 (SUTURE) ×1
SUT VIC AB 2-0 CT1 TAPERPNT 27 (SUTURE) ×2 IMPLANT
SUT VIC AB 3-0 SH 27 (SUTURE) ×1
SUT VIC AB 3-0 SH 27X BRD (SUTURE) ×2 IMPLANT
SUT VIC AB 4-0 KS 27 (SUTURE) ×3 IMPLANT
TOWEL OR 17X24 6PK STRL BLUE (TOWEL DISPOSABLE) ×3 IMPLANT
TRAY FOLEY CATH SILVER 14FR (SET/KITS/TRAYS/PACK) IMPLANT

## 2016-04-22 NOTE — Lactation Note (Signed)
This note was copied from a baby's chart. Lactation Consultation Note  Patient Name: [name] Today's Date: 04/22/2016 Reason for consult: Follow-up assessment Baby at 14 hr of life. Mom requesting help with latch. Mom was sitting in the chair and desired to bf in cradle position. Demonstrated how to pull baby close to get a deeper latch. Mom reported this feeding felt better. Mom is aware of lactation services and support group. She will call as needed.   Maternal Data Has patient been taught Hand Expression?: Yes Does the patient have breastfeeding experience prior to this delivery?: Yes  Feeding Feeding Type: Breast Fed  LATCH Score/Interventions Latch: Grasps breast easily, tongue down, lips flanged, rhythmical sucking.  Audible Swallowing: A few with stimulation Intervention(s): Hand expression  Type of Nipple: Everted at rest and after stimulation  Comfort (Breast/Nipple): Filling, red/small blisters or bruises, mild/mod discomfort  Problem noted: Mild/Moderate discomfort Interventions (Mild/moderate discomfort): Hand expression  Hold (Positioning): Assistance needed to correctly position infant at breast and maintain latch. Intervention(s): Support Pillows;Position options  LATCH Score: 7  Lactation Tools Discussed/Used WIC Program: No   Consult Status Consult Status: Follow-up Date: 04/23/16 Follow-up type: In-patient    Rulon Eisenmengerlizabeth E Wilson Sample 04/22/2016, 9:22 PM

## 2016-04-22 NOTE — Transfer of Care (Signed)
Immediate Anesthesia Transfer of Care Note  Patient: [name]  Procedure(s) Performed: Procedure(s): CESAREAN SECTION WITH BILATERAL TUBAL LIGATION  Patient Location: PACU  Anesthesia Type:Spinal and Epidural  Level of Consciousness: sedated  Airway & Oxygen Therapy: Patient Spontanous Breathing  Post-op Assessment: Report given to RN  Post vital signs: Reviewed and stable  Last Vitals:  Vitals:   04/22/16 0530 04/22/16 0600  BP: (!) 131/93 (!) 136/102  Pulse: (!) 107 (!) 116  Resp: 16   Temp:      Last Pain:  Vitals:   04/22/16 0612  TempSrc:   PainSc: 0-No pain      Patients Stated Pain Goal: 5 (04/21/16 0805)  Complications: No apparent anesthesia complications

## 2016-04-22 NOTE — Anesthesia Postprocedure Evaluation (Signed)
Anesthesia Post Note  Patient: [name]  Procedure(s) Performed: Procedure(s): CESAREAN SECTION WITH BILATERAL TUBAL LIGATION  Patient location during evaluation: Mother Baby Anesthesia Type: Epidural Level of consciousness: awake and alert Pain management: pain level controlled Vital Signs Assessment: post-procedure vital signs reviewed and stable Respiratory status: spontaneous breathing, nonlabored ventilation and respiratory function stable Cardiovascular status: stable Postop Assessment: no headache, no backache and epidural receding Anesthetic complications: no     Last Vitals:  Vitals:   04/22/16 1110 04/22/16 1215  BP: 129/74 128/72  Pulse: (!) 104 (!) 105  Resp: 20 18  Temp: 36.5 C 37.2 C    Last Pain:  Vitals:   04/22/16 1215  TempSrc: Oral  PainSc:    Pain Goal: Patients Stated Pain Goal: 3 (04/22/16 1005)               Junious SilkGILBERT,Neziah Vogelgesang

## 2016-04-22 NOTE — Progress Notes (Addendum)
Patient ID: [name], female   DOB: [birth year], 35 y.o.   MRN: [id number]  Patient having repetitive lates. No cervical change despite pitocin, but not adequate and unable to get adequate due to decels. Discussed options with patient, including rest and recovery for baby vs repeat cesarean. Pt elects to have repeat cesarean at this point.   The risks of cesarean section discussed with the patient included but were not limited to: bleeding which may require transfusion or reoperation; infection which may require antibiotics; injury to bowel, bladder, ureters or other surrounding organs; injury to the fetus; need for additional procedures including hysterectomy in the event of a life-threatening hemorrhage; placental abnormalities wth subsequent pregnancies, incisional problems, thromboembolic phenomenon and other postoperative/anesthesia complications. The patient concurred with the proposed plan, giving informed written consent for the procedure.   Patient has been NPO since last night - she will remain NPO for procedure. Anesthesia and OR aware.  Preoperative prophylactic Ancef ordered on call to the OR.  To OR when ready.  Pt also consented for BTL - risks include regret, failure, increased risk of ectopic pregnancy.  Levie HeritageJacob J Stinson, DO 04/22/2016 6:16 AM

## 2016-04-22 NOTE — Op Note (Signed)
[place] PROCEDURE DATE: 04/21/2016 - 04/22/2016  PREOPERATIVE DIAGNOSIS: Intrauterine pregnancy at  [redacted]w[redacted]d weeks gestation; failed TOLAC, nonreassuring fetal heart status  POSTOPERATIVE DIAGNOSIS: The same  PROCEDURE: Repeat Low Transverse Cesarean Section  SURGEON:  Dr. Candelaria Celeste  ASSISTANT: Dr Willodean Rosenthal, Webb Silversmith, RNFA.  INDICATIONS: [place] is a 35 y.o. [id number] at [redacted]w[redacted]d scheduled for cesarean section secondary to failed TOLAC, nonreassuring fetal heart status.  The risks of cesarean section discussed with the patient included but were not limited to: bleeding which may require transfusion or reoperation; infection which may require antibiotics; injury to bowel, bladder, ureters or other surrounding organs; injury to the fetus; need for additional procedures including hysterectomy in the event of a life-threatening hemorrhage; placental abnormalities wth subsequent pregnancies, incisional problems, thromboembolic phenomenon and other postoperative/anesthesia complications. The patient concurred with the proposed plan, giving informed written consent for the procedure.    FINDINGS:  Viable female infant in vertex, OP presentation.  Apgars 9 and 9, weight, 9 pounds and 7.7 ounces.  Clear amniotic fluid.  Intact placenta, three vessel cord.  Densely adhered uterus to abdominal wall. Bladder densely adhered to lower uterine segment.   ANESTHESIA:    Spinal INTRAVENOUS FLUIDS:1600 ml ESTIMATED BLOOD LOSS: 1500 ml URINE OUTPUT:  200 ml SPECIMENS: Placenta sent to L&D COMPLICATIONS: None immediate  PROCEDURE IN DETAIL:  The patient received intravenous antibiotics and had sequential compression devices applied to her lower extremities while in the preoperative area.  She was then taken to the operating room where epidural anesthesia was dosed up to surgical level and was found to be adequate. She was then placed in a dorsal supine position with a  leftward tilt, and prepped and draped in a sterile manner.  A foley catheter had been placed into her bladder in L&D and attached to constant gravity, which drained clear fluid throughout.  After an adequate timeout was performed, a Pfannenstiel skin incision was made with scalpel and carried through to the underlying layer of fascia. The fascia was incised in the midline and this incision was extended bilaterally using the Mayo scissors. Kocher clamps were applied to the superior aspect of the fascial incision and the underlying rectus muscles were dissected off bluntly, revealing the uterus densely adhered to the abdominal wall. A similar process was carried out on the inferior aspect of the facial incision, revealing the bladder adhered to the lower uterine segment. The rectus muscles were separated in the midline bluntly, creating enough room to make an incision into the lower uterine segment. A bladder blade was placed to protect the bladder. Attention was turned to the lower uterine segment where a transverse hysterotomy was made with a scalpel and extended bilaterally bluntly. The infant was successfully delivered, and cord was clamped and cut and infant was handed over to awaiting neonatology team. Uterine massage was then administered and the placenta delivered intact with three-vessel cord. The uterus was then cleared of clot and debris.  The hysterotomy was closed with 0 Vicryl in a running locked fashion. The bladder was back filled with sterile milk to assure bladder integrity, due to some leaking of serosanguinous pelvic fluid. No leaking of the sterile milk was noted and the bladder was drained. Overall, excellent hemostasis was noted. The abdomen and the pelvis were cleared of all clot and debris and the Jon Gills was removed. Hemostasis was confirmed on all surfaces.  The rectus and peritoneum was reapproximated using 2-0 vicryl. The fascia was then closed using  0 Vicryl in a running fashion. The skin  was closed with 4-0 vicryl. The patient tolerated the procedure well. Sponge, lap, instrument and needle counts were correct x 2. She was taken to the recovery room in stable condition.   Of note, the patient had desired and was consented for a BTL - this was not done due to the densely adhered uterus and the inability to access the fallopian tubes.  Levie HeritageJacob J Stinson, DO 04/22/2016 9:47 AM

## 2016-04-22 NOTE — Plan of Care (Signed)
Problem: Education: Goal: Knowledge of condition will improve Admission paperwork reviewed with patient and significant other. Questions answered and parents verbalize understanding of information. VIS given on pneumococcal vaccine and patient to think about.

## 2016-04-22 NOTE — Progress Notes (Addendum)
Patient ID: Darreld McleanVirginia S Toft, female   DOB: Jan 21, 1981, 35 y.o.   MRN: [id number]  Evaluated patient for progress of labor. Around 1:30am, patient was having repetitive late decelerations, positional changes and oxygen did not resolve these lates. Pitocin was turned off with improvement in the decelerations. Patient comfortable with epidural.    Vitals:   04/22/16 0200 04/22/16 0210  BP: (!) 143/85   Pulse: 100   Resp:    Temp:  98.8 F (37.1 C)    Dilation: 4.5 Effacement (%): 90 Cervical Position: Posterior Station: -1 Presentation: Vertex (via bedside ultrasound) Exam by::  (Janisse Ghan)   FHT: 150, minimal var, no decels now, no accels IUPC: inadequate contractions, q 5 min. Pitocin is off. Prior to d/c pitocin, contractions were still not adequate (<200 MVU).    Restart pitocin at half (7 milli units) If cannot tolerate pitocin, may have to perform cesarean. Discussed with patient. BPs still elevated, pre-E labs have been normal, no new symptoms, patient sleeping.   Cleda ClarksElizabeth W. Nazeer Romney, DO  OB Fellow Center for [facility], [facility]

## 2016-04-22 NOTE — Lactation Note (Signed)
This note was copied from a baby's chart. Lactation Consultation Note  Patient Name: [name] Today's Date: 04/22/2016 Reason for consult: Initial assessment Baby at 13 hr of life. Mom reports bilateral nipple soreness, no skin break down noted. She is concerned because the baby is sleepy. She is not sure baby is getting enough. She bf with no issues with her older child. At 5242m he had teeth and started biting her, so she quit. Discussed baby behavior, feeding frequency, baby belly size, voids, wt loss, breast changes, and nipple care. Demonstrated manual expression, colostrum noted bilaterally, spoon in room. Given lactation handouts. Aware of OP services and support group. Mom would like lactation help to the next bf, she has lactation phone number.       Maternal Data Has patient been taught Hand Expression?: Yes Does the patient have breastfeeding experience prior to this delivery?: Yes  Feeding    LATCH Score/Interventions                      Lactation Tools Discussed/Used WIC Program: No   Consult Status Consult Status: Follow-up Date: 04/23/16 Follow-up type: In-patient    Rulon Eisenmengerlizabeth E Deyanira Fesler 04/22/2016, 8:28 PM

## 2016-04-23 LAB — CBC
HEMATOCRIT: 23.9 % — AB (ref 36.0–46.0)
HEMOGLOBIN: 8.2 g/dL — AB (ref 12.0–15.0)
MCH: 32 pg (ref 26.0–34.0)
MCHC: 34.3 g/dL (ref 30.0–36.0)
MCV: 93.4 fL (ref 78.0–100.0)
Platelets: 83 10*3/uL — ABNORMAL LOW (ref 150–400)
RBC: 2.56 MIL/uL — AB (ref 3.87–5.11)
RDW: 14.4 % (ref 11.5–15.5)
WBC: 17.4 10*3/uL — ABNORMAL HIGH (ref 4.0–10.5)

## 2016-04-23 MED ORDER — FERROUS SULFATE 325 (65 FE) MG PO TABS
325.0000 mg | ORAL_TABLET | Freq: Two times a day (BID) | ORAL | Status: DC
  Administered 2016-04-23 – 2016-04-25 (×5): 325 mg via ORAL
  Filled 2016-04-23 (×5): qty 1

## 2016-04-23 NOTE — Progress Notes (Signed)
Called Dr. Arby BarretteHatchett regarding platelet count and epidural still in. Dr. Arby BarretteHatchett ordered platelet count tomorrow morning at 0600 and will evaluate results and make decision on epidural removal once results reviewed. [name], [name]

## 2016-04-23 NOTE — Lactation Note (Signed)
This note was copied from a baby's chart. Lactation Consultation Note  Baby in nursery after circ.  Mother denies problems or questions. She states bf in going well.  Reminded her to bf on both breasts per feeding. Discussed cluster feeding. Suggest she call if she needs assistance.  Patient Name: [place] Today's Date: 04/23/2016     Maternal Data    Feeding Feeding Type: Breast Fed Length of feed: 5 min  LATCH Score/Interventions                      Lactation Tools Discussed/Used     Consult Status      Hardie PulleyBerkelhammer, Ruth Boschen 04/23/2016, 2:37 PM

## 2016-04-23 NOTE — Progress Notes (Signed)
Platelets 83    Motrin held till further order

## 2016-04-23 NOTE — Progress Notes (Addendum)
Notified Youlanda MightyJosue Santos, MD of patient's platelet level this morning. Notified MD that night shift nurse held Ibuprofen until further notice and patient still has epidural in place due to platelet count. Night shift nurse reported that when she removed NSL, the site took longer than normal to stop bleeding. Lochia has been appropriate and no stains to honeycomb. Notified MD of this information. MD stated he would discontinue ibuprofen and to keep epidural in place for now until further notice. Also informed MD that patient requests infant to be circumcised here in the hospital prior to discharge and has private insurance to cover it. Will continue to monitor patient closely. Earl Galasborne, Linda HedgesStefanie WaterlooHudspeth

## 2016-04-23 NOTE — Plan of Care (Signed)
Problem: Activity: Goal: Risk for activity intolerance will decrease Outcome: Completed/Met Date Met: 04/23/16 Encouraged patient to increase ambulation today and begin ambulating in hallway three times a day.

## 2016-04-23 NOTE — Progress Notes (Signed)
POSTPARTUM PROGRESS NOTE  Post Partum Day POD #1  Subjective:  [name] is a 35 y.o. [id number] 2953w0d s/p RLTCS after failed TOLAC.  No acute events overnight.  Pt denies problems with ambulating, voiding or po intake.  She denies nausea or vomiting.  Pain is moderately controlled.  She has had flatus. She has not had bowel movement.  Lochia Small.   Objective: Blood pressure 98/62, pulse 95, temperature 98.5 F (36.9 C), resp. rate 20, height 5\' 7"  (1.702 m), weight 174 lb (78.9 kg), last menstrual period 07/10/2015, SpO2 99 %, unknown if currently breastfeeding.  Physical Exam:  General: alert, cooperative and no distress Lochia:normal flow Chest: CTAB Heart: RRR no m/r/g Abdomen: +BS, soft, nontender,  Uterine Fundus: firm, Nontender to palpation DVT Evaluation: No calf swelling or tenderness Extremities:  Unable to appreciate edema on exam   Recent Labs  04/22/16 1934 04/23/16 0530  HGB 9.3* 8.2*  HCT 26.8* 23.9*    Assessment/Plan:  ASSESSMENT: [name] is a 35 y.o. [id number] 253w0d s/p RLTCS after failed TOLAC. Gestational thrombocytopenia is mild. - Inpatient Circumcision, Consent signed - Uncertain as to method of birth control at this time, likely plan to schedule BTL - Plan to continue breast feeding  Plan for discharge tomorrow   LOS: 2 days   Lynn ReeJosue D Santos, MD 04/23/2016, 9:24 AM   OB FELLOW POSTPARTUM PROGRESS NOTE ATTESTATION  I have seen and examined this patient and agree with above documentation in the resident's note.   Lynn BilisNoah B Belky Mundo, MD 2:19 PM

## 2016-04-24 LAB — PLATELET COUNT: PLATELETS: 142 10*3/uL — AB (ref 150–400)

## 2016-04-24 MED ORDER — BISACODYL 10 MG RE SUPP
10.0000 mg | Freq: Once | RECTAL | Status: AC
  Administered 2016-04-24: 10 mg via RECTAL
  Filled 2016-04-24: qty 1

## 2016-04-24 NOTE — Lactation Note (Signed)
This note was copied from a baby's chart. Lactation Consultation Note: infant cuing when I arrived in room. Mother up sit in chair for breastfeeding. Mother placed infant in semi cross cradle hold with hands to the back of infants trunk. Infant bobbing on and off breast until finally latched on .Marland Kitchen. Mother states painful at the beginning of the feeding every time. Discussed how to place infant for cross cradle so infant doesn't self latch and hurt her. Also discussed cluster feeding. Infant was observed with wide open mouth and audible swallows. Mother denies having any more concerns . Father did ask if infant should be awakened during the day . Discussed that infant should be feed with all feeding cues. Informed that if infant is not cueing during the he should be awakened.   Patient Name: [name] Today's Date: 04/24/2016 Reason for consult: Follow-up assessment   Maternal Data    Feeding Feeding Type: Breast Fed Length of feed: 30 min  LATCH Score/Interventions Latch: Grasps breast easily, tongue down, lips flanged, rhythmical sucking.  Audible Swallowing: Spontaneous and intermittent Intervention(s): Hand expression  Type of Nipple: Everted at rest and after stimulation  Comfort (Breast/Nipple): Soft / non-tender     Hold (Positioning): No assistance needed to correctly position infant at breast. Intervention(s): Support Pillows;Position options  LATCH Score: 10  Lactation Tools Discussed/Used     Consult Status Consult Status: Follow-up    Stevan BornKendrick, Juquan Reznick [facility] 04/24/2016, 12:38 PM

## 2016-04-24 NOTE — Progress Notes (Signed)
Patient ID: Darreld McleanVirginia S Ravan, female   DOB: April 29, 1981, 35 y.o.   MRN: [id number]  Micah FlesherWent to evaluate patient for flatus/stooling. Patient was in the shower. She stated that after the suppository this AM, she had a large passage of gas (about 15 min after suppository), and then had a very small BM of soft amounts of stool. States she is not too uncomfortable or having pain except with incision site when she is straining to lift herself up or when pressed on abdomen. Tolerating PO diet.  Will perform physical exam when patient is done showering, will return in a few hours to reevaluate. If patient has more flatus and/or stools, can be d/c'd home.  Cleda ClarksElizabeth W. Xara Paulding, DO  OB Fellow Center for [facility], [facility]

## 2016-04-24 NOTE — Progress Notes (Signed)
Subjective: Postpartum Day #2: Cesarean Delivery Patient reports incisional pain, tolerating PO and no problems voiding. Hasn't passed flatus yet. Breastfeeding going well; considering LARC for contraception.   Objective: Vital signs in last 24 hours: Temp:  [97.6 F (36.4 C)-98.6 F (37 C)] 98.6 F (37 C) (10/19 0523) Pulse Rate:  [87-107] 107 (10/19 0523) Resp:  [16-18] 18 (10/19 0523) BP: (110-137)/(67-79) 131/73 (10/19 0523) SpO2:  [99 %] 99 % (10/18 0815)  Physical Exam:  General: alert, cooperative and no distress Lochia: appropriate Uterine Fundus: firm Incision: honeycomb intact and dry DVT Evaluation: No evidence of DVT seen on physical exam.   Recent Labs  04/22/16 1934 04/23/16 0530  HGB 9.3* 8.2*  HCT 26.8* 23.9*   Plts: 146 today  Assessment/Plan: Status post Cesarean section. Postoperative course complicated by no flatus yet; also gestational thrombocytopenia  Continue current care. Will give Ducolax today  Should have epidural catheter pulled today May be able to go home later today.  Cam HaiSHAW, KIMBERLY CNM 04/24/2016, 7:09 AM

## 2016-04-24 NOTE — Progress Notes (Signed)
Patient ID: Lynn McleanVirginia S Graves, female   DOB: 1981/02/25, 35 y.o.   MRN: [id number]  S: Patient reevaluated, sitting in bed changing baby. Patient states she is passing flatus. No BM yet. Feeling improved, but still having some abdominal pain. Able to tolerate diet without N/V, abdl pain.   O:   BP: 136/77; Pulse 107; Resp 18; T 98.59F  CV: RRR, no murmurs Resp: CTAB Abd: Mildly distended, +BS upper quadrants>lower quadrants, tympanic to percussion, appropriately tender to palpation near incisions site, not tender to upper abdomen.  Extr: +1 pitting edema   A/P:  Continue with increased ambulation, PO intake, stool softeners and suppositories. Will stay overnight, reassess in AM.  Cleda ClarksElizabeth W. Mumaw, DO  OB Fellow Center for [facility], [facility]

## 2016-04-25 MED ORDER — OXYCODONE-ACETAMINOPHEN 5-325 MG PO TABS: 1.0000 | ORAL_TABLET | ORAL | 0 refills | Status: DC | PRN

## 2016-04-25 MED ORDER — SENNOSIDES-DOCUSATE SODIUM 8.6-50 MG PO TABS: 2.0000 | ORAL_TABLET | Freq: Every day | ORAL | 1 refills | Status: DC

## 2016-04-25 MED ORDER — IBUPROFEN 600 MG PO TABS
600.0000 mg | ORAL_TABLET | Freq: Four times a day (QID) | ORAL | 0 refills | Status: DC | PRN
Start: 2016-04-25 — End: 2021-04-24

## 2016-04-25 MED ORDER — POLYETHYLENE GLYCOL 3350 17 G PO PACK
17.0000 g | PACK | Freq: Every day | ORAL | Status: DC
  Administered 2016-04-25: 17 g via ORAL
  Filled 2016-04-25 (×2): qty 1

## 2016-04-25 NOTE — Lactation Note (Signed)
This note was copied from a baby's chart. Lactation Consultation Note  Patient Name: [name] Today's Date: 04/25/2016 Reason for consult: Follow-up assessment (after feeding encouraged mom to post pump ) 10 % weight loss  3 rd LC visit today , mom called LC on nurses light has LC had requested for feeding assessment Mom to sore to latch on the left breast. So LC stressed the importance of post pumping on the left breast for 15 -20 mins  And feed on the right with 51F SNS to enhance the milk coming in and until the left nipple has improved with soreness.  When LC walked in mom had already latched the baby on the right breast modified cross cradle with depth and dressed.  Baby noted to be deeply latched and very sluggish with few swallows, LC added the 51F SNS, and sluggishness improved Intermittently. LC had mom release the suction, nipple well rounded, LC undressed the baby checked the diaper, which was  Dry and re-latched same breast modified cross cradle for 10 mins wand 51F SNS. Baby more aggressive with feeding.  And ended up taking total of 16.5 ml of supplement of formula via SNS. Baby relaxed after feeding, and nipple well rounded  When baby released.  LC encouraged mom to post pump after feedings , save milk to be used in the SNS.  MBU RN Pleas KochJessica SErva aware of [facility] plan. LC feels mom will need assist with inserting the SNS after the baby is latched and dad will have to be shown.    Maternal Data    Feeding Feeding Type: Breast Milk with Formula added (re-latched ) Length of feed: 10 min  LATCH Score/Interventions Latch: Grasps breast easily, tongue down, lips flanged, rhythmical sucking.  Audible Swallowing: Spontaneous and intermittent (increased swallows with SNS )  Type of Nipple: Everted at rest and after stimulation  Comfort (Breast/Nipple): Soft / non-tender     Hold (Positioning): Assistance needed to correctly position infant at breast and maintain  latch. Intervention(s): Breastfeeding basics reviewed;Support Pillows;Position options;Skin to skin  LATCH Score: 9  Lactation Tools Discussed/Used Tools: 51F feeding tube / Syringe Initiated by::  (already set up , LC encouraged psot pump after feedings )   Consult Status Consult Status: Follow-up Date: 04/26/16 Follow-up type: In-patient    Matilde SprangMargaret Ann Sharian Delia 04/25/2016, 3:34 PM

## 2016-04-25 NOTE — Lactation Note (Signed)
This note was copied from a baby's chart. Lactation Consultation Note Mom very tearful stating baby isn't satisfied, her nipples hurt, baby wanting to BF constantly she was tired. Asking for formula. Educated cluster feeding. Assessed moms nipples. Has bilateral stripes. Gave comfort gels. Offered to syring feed formula. Mom agreed. Mom was holding baby in her arms in bed. Baby had on long sleeves outfit, thin blanket, and a hat on. When I took baby her was very hot. Removed hat and blanket. Syring fed a little formula, RN came into rm. Asked for a thermometer, temp. Was 100.6. Unzipped outfit. Cont. To feed baby. Informed parents of temp. Discussed thing that could make baby hot. Temp. Down to 99.2 V/s charted. Baby sleeping soundly. Informed mom staff would recheck in 1 hour. Offered to take baby to nursery for monitoring since parents sleeping, mom wanted baby in rm. Encouraged to call if noticed changes. Baby left sleeping w/short sleeve onsie, thin cover wrapped around baby w/arms out. Reported to RN to f/u.  At 64 hrs .old baby had 8 voids and 9 stools. No stool in 24 hrs. Baby passing gas. Mom stated she wanted LC to f/u in am, she was to tired to do lactation stuff tonight.  Patient Name: [name] Today's Date: 04/25/2016 Reason for consult: Follow-up assessment;Infant weight loss;Breast/nipple pain   Maternal Data    Feeding Feeding Type: Formula Length of feed: 15 min  LATCH Score/Interventions Latch: Grasps breast easily, tongue down, lips flanged, rhythmical sucking.  Audible Swallowing: Spontaneous and intermittent Intervention(s): Hand expression Intervention(s): Hand expression  Type of Nipple: Everted at rest and after stimulation  Comfort (Breast/Nipple): Engorged, cracked, bleeding, large blisters, severe discomfort Problem noted: Cracked, bleeding, blisters, bruises Intervention(s): Double electric pump  Problem noted: Cracked, bleeding, blisters,  bruises;Severe discomfort Interventions  (Cracked/bleeding/bruising/blister): Double electric pump Interventions (Mild/moderate discomfort): Comfort gels  Hold (Positioning): No assistance needed to correctly position infant at breast.  LATCH Score: 9  Lactation Tools Discussed/Used Initiated by:: rn   Consult Status Consult Status: Follow-up Date: 04/25/16 Follow-up type: In-patient    Sekou Zuckerman, Diamond NickelLAURA G 04/25/2016, 2:58 AM

## 2016-04-25 NOTE — Lactation Note (Signed)
This note was copied from a baby's chart. Lactation Consultation Note  Patient Name: [name] Today's Date: 04/25/2016 Reason for consult: Follow-up assessment;Other (Comment);Infant weight loss (10% weight loss , per mom baby yrecently breast fed on the left breast , encouraged to page next feed )  Spoke to mom discussing weight loss, and the need to supplement with every feeding or after feeding. LC spoke about the SNS at the breast. And post pumping to enhance milk coming  In.    Maternal Data    Feeding    LATCH Score/Interventions                Intervention(s): Breastfeeding basics reviewed     Lactation Tools Discussed/Used     Consult Status Consult Status: Follow-up Date: 04/25/16 Follow-up type: In-patient    Matilde SprangMargaret Ann Willma Obando 04/25/2016, 1:46 PM

## 2016-04-25 NOTE — Discharge Instructions (Signed)
Cesarean Delivery, Care After  Refer to this sheet in the next few weeks. These instructions provide you with information on caring for yourself after your procedure. Your health care provider may also give you specific instructions. Your treatment has been planned according to current medical practices, but problems sometimes occur. Call your health care provider if you have any problems or questions after you go home.  HOME CARE INSTRUCTIONS   Only take over-the-counter or prescription medications as directed by your health care provider.   Do not drink alcohol, especially if you are breastfeeding or taking medication to relieve pain.   Do not chew or smoke tobacco.   Continue to use good perineal care. Good perineal care includes:    Wiping your perineum from front to back.    Keeping your perineum clean.   Check your surgical cut (incision) daily for increased redness, drainage, swelling, or separation of skin.   Clean your incision gently with soap and water every day, and then pat it dry. If your health care provider says it is okay, leave the incision uncovered. Use a bandage (dressing) if the incision is draining fluid or appears irritated. If the adhesive strips across the incision do not fall off within 7 days, carefully peel them off.   Hug a pillow when coughing or sneezing until your incision is healed. This helps to relieve pain.   Do not use tampons or douche until your health care provider says it is okay.   Shower, wash your hair, and take tub baths as directed by your health care provider.   Wear a well-fitting bra that provides breast support.   Limit wearing support panties or control-top hose.   Drink enough fluids to keep your urine clear or pale yellow.   Eat high-fiber foods such as whole grain cereals and breads, brown rice, beans, and fresh fruits and vegetables every day. These foods may help prevent or relieve constipation.   Resume activities such as climbing stairs,  driving, lifting, exercising, or traveling as directed by your health care provider.   Talk to your health care provider about resuming sexual activities. This is dependent upon your risk of infection, your rate of healing, and your comfort and desire to resume sexual activity.   Try to have someone help you with your household activities and your newborn for at least a few days after you leave the hospital.   Rest as much as possible. Try to rest or take a nap when your newborn is sleeping.   Increase your activities gradually.   Keep all of your scheduled postpartum appointments. It is very important to keep your scheduled follow-up appointments. At these appointments, your health care provider will be checking to make sure that you are healing physically and emotionally.  SEEK MEDICAL CARE IF:    You are passing large clots from your vagina. Save any clots to show your health care provider.   You have a foul smelling discharge from your vagina.   You have trouble urinating.   You are urinating frequently.   You have pain when you urinate.   You have a change in your bowel movements.   You have increasing redness, pain, or swelling near your incision.   You have pus draining from your incision.   Your incision is separating.   You have painful, hard, or reddened breasts.   You have a severe headache.   You have blurred vision or see spots.   You feel sad   or depressed.   You have thoughts of hurting yourself or your newborn.   You have questions about your care, the care of your newborn, or medications.   You are dizzy or light-headed.   You have a rash.   You have pain, redness, or swelling at the site of the removed intravenous access (IV) tube.   You have nausea or vomiting.   You stopped breastfeeding and have not had a menstrual period within 12 weeks of stopping.   You are not breastfeeding and have not had a menstrual period within 12 weeks of delivery.   You have a fever.  SEEK  IMMEDIATE MEDICAL CARE IF:   You have persistent pain.   You have chest pain.   You have shortness of breath.   You faint.   You have leg pain.   You have stomach pain.   Your vaginal bleeding saturates 2 or more sanitary pads in 1 hour.  MAKE SURE YOU:    Understand these instructions.   Will watch your condition.   Will get help right away if you are not doing well or get worse.     This information is not intended to replace advice given to you by your health care provider. Make sure you discuss any questions you have with your health care provider.     Document Released: 03/15/2002 Document Revised: 07/14/2014 Document Reviewed: 02/18/2012  Elsevier Interactive Patient Education 2016 [employer] Inc[employer]

## 2016-04-25 NOTE — Discharge Summary (Signed)
OB Discharge Summary     Patient Name: [name] DOB: [birth year] MRN: [id number]  Date of admission: 04/21/2016 Delivering MD: Levie Heritage   Date of discharge: 04/25/2016  Admitting diagnosis: 40 WEEKS CTX Intrauterine pregnancy: [redacted]w[redacted]d     Secondary diagnosis:  Active Problems:   Normal labor  Additional problems: advanced maternal age     Discharge diagnosis: Term Pregnancy Delivered and gestational thrombocytopenia                                                                                                Post partum procedures:none  Augmentation: Pitocin  Complications: None  Hospital course:  Onset of Labor With Unplanned C/S  35 y.o. yo G2P2002 at [redacted]w[redacted]d was admitted in Active Labor on 04/21/2016. Patient had a labor course significant for failed TOLAC. Membrane Rupture Time/Date: 5:34 AM ,04/21/2016   The patient went for cesarean section due to Non-Reassuring FHR, and delivered a Viable infant,04/22/2016  Details of operation can be found in separate operative note. Patient had an uncomplicated postpartum course aside from delayed flatus.  She is ambulating,tolerating a regular diet, passing flatus, and urinating well, still has not stooled.  Patient is discharged home in stable condition 04/25/16.   Physical exam Vitals:   04/23/16 2151 04/24/16 0523 04/24/16 1816 04/25/16 0505  BP: 137/79 131/73 136/77 125/78  Pulse: 100 (!) 107 (!) 107 93  Resp: 18 18 18 16   Temp: 97.6 F (36.4 C) 98.6 F (37 C) 98.2 F (36.8 C) 98.2 F (36.8 C)  TempSrc: Axillary Oral Oral Oral  SpO2:      Weight:      Height:       General: alert, cooperative and no distress Lochia: appropriate Uterine Fundus: firm Incision: Dressing is clean, dry, and intact DVT Evaluation: No evidence of DVT seen on physical exam. Labs: Lab Results  Component Value Date   WBC 17.4 (H) 04/23/2016   HGB 8.2 (L) 04/23/2016   HCT 23.9 (L) 04/23/2016   MCV 93.4 04/23/2016   PLT 142  (L) 04/24/2016   CMP Latest Ref Rng & Units 04/21/2016  Glucose 65 - 99 mg/dL 73  BUN 6 - 20 mg/dL 8  Creatinine 0.86 - 5.78 mg/dL 4.69  Sodium 629 - 528 mmol/L 133(L)  Potassium 3.5 - 5.1 mmol/L 3.3(L)  Chloride 101 - 111 mmol/L 102  CO2 22 - 32 mmol/L 20(L)  Calcium 8.9 - 10.3 mg/dL 4.1(L)  Total Protein 6.5 - 8.1 g/dL 6.2(L)  Total Bilirubin 0.3 - 1.2 mg/dL 0.7  Alkaline Phos 38 - 126 U/L 267(H)  AST 15 - 41 U/L 22  ALT 14 - 54 U/L 11(L)    Discharge instruction: per After Visit Summary and "Baby and Me Booklet".  After visit meds:    Medication List    TAKE these medications   acetaminophen 500 MG tablet Commonly known as:  TYLENOL Take 1,000 mg by mouth every 6 (six) hours as needed for mild pain.   albuterol 1.25 MG/3ML nebulizer solution Commonly known as:  ACCUNEB Take 1 ampule by nebulization every 6 (  six) hours as needed for wheezing.   VENTOLIN HFA 108 (90 Base) MCG/ACT inhaler Generic drug:  albuterol INHALE 1 TO 2 PUFFS BY MOUTH EVERY 4 HOURS AS NEEDED   ibuprofen 600 MG tablet Commonly known as:  ADVIL,MOTRIN Take 1 tablet (600 mg total) by mouth every 6 (six) hours as needed for mild pain or moderate pain.   montelukast 10 MG tablet Commonly known as:  SINGULAIR Take 10 mg by mouth at bedtime.   multivitamin-prenatal 27-0.8 MG Tabs tablet Take 1 tablet by mouth daily at 12 noon.   oxyCODONE-acetaminophen 5-325 MG tablet Commonly known as:  PERCOCET/ROXICET Take 1 tablet by mouth every 4 (four) hours as needed (pain scale 4-7).   senna-docusate 8.6-50 MG tablet Commonly known as:  Senokot-S Take 2 tablets by mouth at bedtime.       Diet: routine diet  Activity: Advance as tolerated. Pelvic rest for 6 weeks.   Outpatient follow up:2 weeks Follow up Appt:Future Appointments Date Time Provider Department Center  06/02/2016 1:30 PM Tereso NewcomerUgonna A Anyanwu, MD [facility]   Follow up Visit:No Follow-up on file. 6 weeks postpartum or  sooner  Postpartum contraception: IUD possibly or, Tubal Ligation and Undecided  Newborn Data: Live born female  Birth Weight: 9 lb 7.7 oz (4300 g) APGAR: 9, 9  Baby Feeding: Breast Disposition:home with mother   04/25/2016 Tillman SersAngela C Riccio, DO   OB FELLOW DISCHARGE ATTESTATION  I have seen and examined this patient and agree with above documentation in the resident's note.   Jen MowElizabeth Carlyon Nolasco, DO OB Fellow 10:06 AM

## 2016-04-26 ENCOUNTER — Ambulatory Visit: Payer: Self-pay

## 2016-04-26 NOTE — Lactation Note (Signed)
This note was copied from a baby's chart. Lactation Consultation Note  Patient Name: Boy [place] Steinhaus Today's Date: 04/26/2016 Reason for consult: Follow-up assessment Infant is 214 days old & [facility]. Infant's wt loss is 9.4% at 1057 today, up from 12.1% at 2354 04/25/16. Mom reports that the last time she pumped was at 4am this morning but did not get very much and has not pumped or BF since then and that they have been focusing on the formula. When asked what their plan was going home, mom reported that she plans to BF & pump more once home and believe they need to keep using the SNS with formula because "he's getting used to the taste of the formula." Mom asked when her milk should come in since she isn't seeing much when she pumps. Discussed how SNS's are for a temporary aid until milk volume increases but that stimulation is very important for her body to know to make more milk - reinforced importance of BF and/ or pumping at least 8-12x in 24hrs. Suggested mom could pre-pump before BF to initiate a let down because sometimes babies get used to the flow of a bottle & can get frustrated at the breast if it doesn't start flowing right away. When asked about an Outpatient lactation appointment, mom requested that we not make the appointment today but that she'll call if she feels she needs help later. Reminded mom of lactation number & support groups. Mom reports no questions at this time. LC tried to call RN to inform of pt's status but was unable to reach so secretary said she would pass along this information that they were ready to go.   Maternal Data    Feeding Feeding Type: Bottle Fed - Formula  LATCH Score/Interventions                      Lactation Tools Discussed/Used     Consult Status Consult Status: Complete    Oneal GroutLaura C Henrique Parekh 04/26/2016, 1:21 PM

## 2016-06-02 ENCOUNTER — Ambulatory Visit (INDEPENDENT_AMBULATORY_CARE_PROVIDER_SITE_OTHER): Payer: BC Managed Care – PPO | Admitting: Obstetrics & Gynecology

## 2016-06-02 DIAGNOSIS — Z30011 Encounter for initial prescription of contraceptive pills: Secondary | ICD-10-CM

## 2016-06-02 DIAGNOSIS — Z3009 Encounter for other general counseling and advice on contraception: Secondary | ICD-10-CM

## 2016-06-02 MED ORDER — NORETHINDRONE 0.35 MG PO TABS: 1.0000 | ORAL_TABLET | Freq: Every day | ORAL | 11 refills | Status: DC

## 2016-06-02 NOTE — Patient Instructions (Signed)
Laparoscopic Tubal Ligation Introduction Laparoscopic tubal ligation is a procedure to close the fallopian tubes. This is done so that you cannot get pregnant. When the fallopian tubes are closed, the eggs that your ovaries release cannot enter the uterus, and sperm cannot reach the released eggs. A laparoscopic tubal ligation is sometimes called "getting your tubes tied." You should not have this procedure if you want to get pregnant someday or if you are unsure about having more children. Tell a health care provider about:  Any allergies you have.  All medicines you are taking, including vitamins, herbs, eye drops, creams, and over-the-counter medicines.  Any problems you or family members have had with anesthetic medicines.  Any blood disorders you have.  Any surgeries you have had.  Any medical conditions you have.  Whether you are pregnant or may be pregnant.  Any past pregnancies. What are the risks? Generally, this is a safe procedure. However, problems may occur, including:  Infection.  Bleeding.  Injury to surrounding organs.  Side effects from anesthetics.  Failure of the procedure. This procedure can increase your risk of a kind of pregnancy in which a fertilized egg attaches to the outside of the uterus (ectopic pregnancy). What happens before the procedure?  Ask your health care provider about:  Changing or stopping your regular medicines. This is especially important if you are taking diabetes medicines or blood thinners.  Taking medicines such as aspirin and ibuprofen. These medicines can thin your blood. Do not take these medicines before your procedure if your health care provider instructs you not to.  Follow instructions from your health care provider about eating and drinking restrictions.  Plan to have someone take you home after the procedure.  If you go home right after the procedure, plan to have someone with you for 24 hours. What happens during  the procedure?  You will be given one or more of the following:  A medicine to help you relax (sedative).  A medicine to numb the area (local anesthetic).  A medicine to make you fall asleep (general anesthetic).  A medicine that is injected into an area of your body to numb everything below the injection site (regional anesthetic).  An IV tube will be inserted into one of your veins. It will be used to give you medicines and fluids during the procedure.  Your bladder may be emptied with a small tube (catheter).  If you have been given a general anesthetic, a tube will be put down your throat to help you breathe.  Two small cuts (incisions) will be made in your lower abdomen and near your belly button.  Your abdomen will be inflated with a gas. This will let the surgeon see better and will give the surgeon room to work.  A thin, lighted tube (laparoscope) with a camera attached will be inserted into your abdomen through one of the incisions. Small instruments will be inserted through the other incision.  The fallopian tubes will be tied off, burned (cauterized), or blocked with a clip, ring, or clamp. A small portion in the center of each fallopian tube may be removed.  The gas will be released from the abdomen.  The incisions will be closed with stitches (sutures).  A bandage (dressing) will be placed over the incisions. The procedure may vary among health care providers and hospitals. What happens after the procedure?  Your blood pressure, heart rate, breathing rate, and blood oxygen level will be monitored often until the medicines you   were given have worn off.  You will be given medicine to help with pain, nausea, and vomiting as needed. This information is not intended to replace advice given to you by your health care provider. Make sure you discuss any questions you have with your health care provider. Document Released: 09/29/2000 Document Revised: 11/29/2015 Document  Reviewed: 06/03/2015  2017 Elsevier  

## 2016-06-02 NOTE — Progress Notes (Signed)
Post Partum Exam  [place] is a 35 y.o. [id number] female who presents for a postpartum visit. She is 6 weeks postpartum following a repeat low transverse cesarean section after failed TOLAC attempt at 41 weeks.  Surgery was remarkable for dense adhesions of the uterus and bladder to anterior abdominal; BTS was unable to be done due to these adhesions. I have fully reviewed the prenatal and intrapartum course. Postpartum course has been unremarkable.  Baby's course has been unremarkable. Baby is feeding by both breast and bottle - Similac Advance. Bleeding no bleeding. Bowel function is normal. Bladder function is normal. Patient is not sexually active. Desired contraception method for now is oral progesterone-only contraceptive, would like BTL at a later time. Postpartum depression screening: negative.  The following portions of the patient's history were reviewed and updated as appropriate: allergies, current medications, past family history, past medical history, past social history, past surgical history and problem list. Normal pap on 12/15/14.  Review of Systems Pertinent items noted in HPI and remainder of comprehensive ROS otherwise negative.    Objective:   BP 116/78 mmHg  Pulse 78  Resp 16  Ht 5\' 5"  (1.651 m)  Wt 211 lb (95.709 kg)  BMI 35.11 kg/m2  Breastfeeding? Yes  General:  alert and no distress   Breasts:  inspection negative, no nipple discharge or bleeding, no masses or nodularity palpable  Lungs: clear to auscultation bilaterally  Heart:  regular rate and rhythm  Abdomen: soft, non-tender; bowel sounds normal; no masses,  no organomegaly. Incision C/D/I, no erythema, no drainage  Pelvic:  not evaluated        Assessment and Plan:  1. Postpartum examination following cesarean delivery Normal postpartum exam.   2. Encounter for prescription of contraceptive pills - norethindrone (MICRONOR,CAMILA,ERRIN) 0.35 MG tablet; Take 1 tablet (0.35 mg total) by mouth daily.   Dispense: 1 Package; Refill: 11  3. Sterilization consult Given the adhesions noted during cesarean section, offered laparoscopic approach (BTS with Filshie clips).  Other reversible forms of contraception were discussed with patient; she declines all other modalities.  Risks and benefits discussed in detail including but not limited to: risk of regret, permanence of method, bleeding, infection, injury to surrounding organs and need for additional procedures.  Failure risk of 1-2 % with increased risk of ectopic gestation if pregnancy occurs was also discussed with patient.  Patient verbalized understanding of these risks and benefits and wants to proceed with sterilization with laparoscopic bilateral sterilization using Filshie clips.    She was told that she will be contacted by our surgical scheduler regarding the time and date of her surgery; routine preoperative instructions of having nothing to eat or drink after midnight on the day prior to surgery and also coming to the hospital 1 1/2 hours prior to her time of surgery were also emphasized.  She was told she may be called for a preoperative appointment about a week prior to surgery and will be given further preoperative instructions at that visit.  Routine postoperative instructions will be reviewed with the patient and her family in detail after surgery. Printed patient education handouts about the procedure was given to the patient to review at home.   [name]  Jazarah Capili, MD, FACOG Attending Obstetrician & Gynecologist, [facility], [facility]

## 2016-06-03 ENCOUNTER — Encounter (HOSPITAL_COMMUNITY): Payer: Self-pay

## 2016-06-18 NOTE — Patient Instructions (Signed)
Your procedure is scheduled on:  Thursday, Dec. 21, 2017  [facility] at: 9:45 AM  Pick up the phone at the desk and dial [phone].  Call this number if you have problems the morning of surgery: [phone].  Remember: Do NOT eat food or drink after:  Midnight Wednesday, Dec. 20, 2017  Take these medicines the morning of surgery with a SIP OF WATER:  None  Bring Asthma Inhaler day of surgery  Stop ALL herbal medications at this time   Do NOT wear jewelry (body piercing), metal hair clips/bobby pins, make-up, or nail polish. Do NOT wear lotions, powders, or perfumes.  You may wear deodorant. Do NOT shave for 48 hours prior to surgery. Do NOT bring valuables to the hospital. Contacts, dentures, or bridgework may not be worn into surgery.  Have a responsible adult drive you home and stay with you for 24 hours after your procedure

## 2016-06-19 ENCOUNTER — Encounter (HOSPITAL_COMMUNITY)
Admission: RE | Admit: 2016-06-19 | Discharge: 2016-06-19 | Disposition: A | Discharge status: Home / Self Care | Payer: BC Managed Care – PPO | Source: Ambulatory Visit | Attending: Obstetrics & Gynecology | Admitting: Obstetrics & Gynecology

## 2016-06-19 ENCOUNTER — Encounter (HOSPITAL_COMMUNITY): Payer: Self-pay

## 2016-06-19 DIAGNOSIS — Z01812 Encounter for preprocedural laboratory examination: Secondary | ICD-10-CM | POA: Diagnosis present

## 2016-06-19 DIAGNOSIS — K219 Gastro-esophageal reflux disease without esophagitis: Secondary | ICD-10-CM | POA: Diagnosis not present

## 2016-06-19 DIAGNOSIS — D691 Qualitative platelet defects: Secondary | ICD-10-CM | POA: Insufficient documentation

## 2016-06-19 DIAGNOSIS — G43909 Migraine, unspecified, not intractable, without status migrainosus: Secondary | ICD-10-CM | POA: Insufficient documentation

## 2016-06-19 DIAGNOSIS — J45909 Unspecified asthma, uncomplicated: Secondary | ICD-10-CM | POA: Insufficient documentation

## 2016-06-19 HISTORY — DX: Thrombocytopenia, unspecified: D69.6

## 2016-06-19 HISTORY — DX: Gastro-esophageal reflux disease without esophagitis: K21.9

## 2016-06-19 LAB — CBC
HCT: 38.5 % (ref 36.0–46.0)
Hemoglobin: 12.8 g/dL (ref 12.0–15.0)
MCH: 30 pg (ref 26.0–34.0)
MCHC: 33.2 g/dL (ref 30.0–36.0)
MCV: 90.2 fL (ref 78.0–100.0)
PLATELETS: 200 10*3/uL (ref 150–400)
RBC: 4.27 MIL/uL (ref 3.87–5.11)
RDW: 14.1 % (ref 11.5–15.5)
WBC: 6.9 10*3/uL (ref 4.0–10.5)

## 2016-06-26 ENCOUNTER — Encounter (HOSPITAL_COMMUNITY): Payer: Self-pay | Admitting: Emergency Medicine

## 2016-06-26 ENCOUNTER — Ambulatory Visit (HOSPITAL_COMMUNITY): Payer: BC Managed Care – PPO | Admitting: Anesthesiology

## 2016-06-26 ENCOUNTER — Encounter (HOSPITAL_COMMUNITY)
Admission: RE | Disposition: A | Discharge status: Home / Self Care | Payer: Self-pay | Source: Ambulatory Visit | Attending: Obstetrics & Gynecology

## 2016-06-26 ENCOUNTER — Ambulatory Visit (HOSPITAL_COMMUNITY)
Admission: RE | Admit: 2016-06-26 | Discharge: 2016-06-26 | Disposition: A | Discharge status: Home / Self Care | Payer: BC Managed Care – PPO | Source: Ambulatory Visit | Attending: Obstetrics & Gynecology | Admitting: Obstetrics & Gynecology

## 2016-06-26 DIAGNOSIS — Z3009 Encounter for other general counseling and advice on contraception: Secondary | ICD-10-CM | POA: Diagnosis present

## 2016-06-26 DIAGNOSIS — Z302 Encounter for sterilization: Secondary | ICD-10-CM

## 2016-06-26 DIAGNOSIS — K219 Gastro-esophageal reflux disease without esophagitis: Secondary | ICD-10-CM | POA: Insufficient documentation

## 2016-06-26 HISTORY — PX: LAPAROSCOPIC TUBAL LIGATION: SHX1937

## 2016-06-26 LAB — PREGNANCY, URINE: Preg Test, Ur: NEGATIVE

## 2016-06-26 SURGERY — LIGATION, FALLOPIAN TUBE, LAPAROSCOPIC
Anesthesia: General | Site: Abdomen | Laterality: Bilateral

## 2016-06-26 MED ORDER — BUPIVACAINE HCL (PF) 0.5 % IJ SOLN
INTRAMUSCULAR | Status: DC | PRN
  Administered 2016-06-26: 16 mL

## 2016-06-26 MED ORDER — SCOPOLAMINE 1 MG/3DAYS TD PT72: 1.0000 | MEDICATED_PATCH | Freq: Once | TRANSDERMAL | Status: DC

## 2016-06-26 MED ORDER — ONDANSETRON HCL 4 MG/2ML IJ SOLN
INTRAMUSCULAR | Status: DC | PRN
  Administered 2016-06-26: 4 mg via INTRAVENOUS

## 2016-06-26 MED ORDER — NEOSTIGMINE METHYLSULFATE 10 MG/10ML IV SOLN
INTRAVENOUS | Status: AC
  Filled 2016-06-26: qty 1

## 2016-06-26 MED ORDER — PROPOFOL 10 MG/ML IV BOLUS
INTRAVENOUS | Status: AC
  Filled 2016-06-26: qty 20

## 2016-06-26 MED ORDER — SUGAMMADEX SODIUM 200 MG/2ML IV SOLN
INTRAVENOUS | Status: AC
  Filled 2016-06-26: qty 2

## 2016-06-26 MED ORDER — DEXAMETHASONE SODIUM PHOSPHATE 10 MG/ML IJ SOLN
INTRAMUSCULAR | Status: DC | PRN
  Administered 2016-06-26: 4 mg via INTRAVENOUS

## 2016-06-26 MED ORDER — PROPOFOL 10 MG/ML IV BOLUS
INTRAVENOUS | Status: DC | PRN
  Administered 2016-06-26: 200 mg via INTRAVENOUS

## 2016-06-26 MED ORDER — FENTANYL CITRATE (PF) 100 MCG/2ML IJ SOLN: 25.0000 ug | INTRAMUSCULAR | Status: DC | PRN

## 2016-06-26 MED ORDER — KETOROLAC TROMETHAMINE 30 MG/ML IJ SOLN
INTRAMUSCULAR | Status: AC
  Filled 2016-06-26: qty 1

## 2016-06-26 MED ORDER — KETOROLAC TROMETHAMINE 30 MG/ML IJ SOLN
INTRAMUSCULAR | Status: DC | PRN
  Administered 2016-06-26: 30 mg via INTRAVENOUS

## 2016-06-26 MED ORDER — SCOPOLAMINE 1 MG/3DAYS TD PT72
MEDICATED_PATCH | TRANSDERMAL | Status: AC
  Filled 2016-06-26: qty 1

## 2016-06-26 MED ORDER — MEPERIDINE HCL 25 MG/ML IJ SOLN: 6.2500 mg | INTRAMUSCULAR | Status: DC | PRN

## 2016-06-26 MED ORDER — ROCURONIUM BROMIDE 100 MG/10ML IV SOLN
INTRAVENOUS | Status: DC | PRN
  Administered 2016-06-26: 30 mg via INTRAVENOUS

## 2016-06-26 MED ORDER — ONDANSETRON HCL 4 MG/2ML IJ SOLN
INTRAMUSCULAR | Status: AC
  Filled 2016-06-26: qty 2

## 2016-06-26 MED ORDER — KETOROLAC TROMETHAMINE 30 MG/ML IJ SOLN: 30.0000 mg | Freq: Once | INTRAMUSCULAR | Status: DC

## 2016-06-26 MED ORDER — LACTATED RINGERS IV SOLN: INTRAVENOUS | Status: DC

## 2016-06-26 MED ORDER — MIDAZOLAM HCL 5 MG/5ML IJ SOLN
INTRAMUSCULAR | Status: DC | PRN
  Administered 2016-06-26: 2 mg via INTRAVENOUS

## 2016-06-26 MED ORDER — LIDOCAINE HCL (CARDIAC) 20 MG/ML IV SOLN
INTRAVENOUS | Status: AC
  Filled 2016-06-26: qty 5

## 2016-06-26 MED ORDER — MIDAZOLAM HCL 2 MG/2ML IJ SOLN
INTRAMUSCULAR | Status: AC
  Filled 2016-06-26: qty 2

## 2016-06-26 MED ORDER — ONDANSETRON HCL 4 MG/2ML IJ SOLN: 4.0000 mg | Freq: Once | INTRAMUSCULAR | Status: DC | PRN

## 2016-06-26 MED ORDER — LIDOCAINE HCL (CARDIAC) 20 MG/ML IV SOLN
INTRAVENOUS | Status: DC | PRN
  Administered 2016-06-26: 60 mg via INTRAVENOUS

## 2016-06-26 MED ORDER — LACTATED RINGERS IV SOLN
INTRAVENOUS | Status: DC
  Administered 2016-06-26 (×2): via INTRAVENOUS

## 2016-06-26 MED ORDER — FENTANYL CITRATE (PF) 250 MCG/5ML IJ SOLN
INTRAMUSCULAR | Status: AC
  Filled 2016-06-26: qty 5

## 2016-06-26 MED ORDER — GLYCOPYRROLATE 0.2 MG/ML IJ SOLN
INTRAMUSCULAR | Status: AC
  Filled 2016-06-26: qty 4

## 2016-06-26 MED ORDER — SUGAMMADEX SODIUM 200 MG/2ML IV SOLN
INTRAVENOUS | Status: DC | PRN
  Administered 2016-06-26: 200 mg via INTRAVENOUS

## 2016-06-26 MED ORDER — FENTANYL CITRATE (PF) 250 MCG/5ML IJ SOLN
INTRAMUSCULAR | Status: DC | PRN
  Administered 2016-06-26 (×2): 100 ug via INTRAVENOUS
  Administered 2016-06-26: 50 ug via INTRAVENOUS

## 2016-06-26 MED ORDER — DEXAMETHASONE SODIUM PHOSPHATE 10 MG/ML IJ SOLN
INTRAMUSCULAR | Status: AC
  Filled 2016-06-26: qty 1

## 2016-06-26 MED ORDER — BUPIVACAINE HCL (PF) 0.5 % IJ SOLN
INTRAMUSCULAR | Status: AC
  Filled 2016-06-26: qty 30

## 2016-06-26 SURGICAL SUPPLY — 24 items
CATH ROBINSON RED A/P 16FR (CATHETERS) ×2 IMPLANT
CLIP FILSHIE TUBAL LIGA STRL (Clip) ×2 IMPLANT
CLOTH BEACON ORANGE TIMEOUT ST (SAFETY) ×2 IMPLANT
DERMABOND ADVANCED (GAUZE/BANDAGES/DRESSINGS) ×1
DERMABOND ADVANCED .7 DNX12 (GAUZE/BANDAGES/DRESSINGS) ×1 IMPLANT
DRSG OPSITE POSTOP 3X4 (GAUZE/BANDAGES/DRESSINGS) ×2 IMPLANT
DURAPREP 26ML APPLICATOR (WOUND CARE) ×2 IMPLANT
GLOVE BIOGEL PI IND STRL 7.0 (GLOVE) ×3 IMPLANT
GLOVE BIOGEL PI INDICATOR 7.0 (GLOVE) ×3
GLOVE ECLIPSE 7.0 STRL STRAW (GLOVE) ×2 IMPLANT
GOWN STRL REUS W/TWL LRG LVL3 (GOWN DISPOSABLE) ×4 IMPLANT
NEEDLE INSUFFLATION 120MM (ENDOMECHANICALS) ×2 IMPLANT
PACK LAPAROSCOPY BASIN (CUSTOM PROCEDURE TRAY) ×2 IMPLANT
PACK TRENDGUARD 450 HYBRID PRO (MISCELLANEOUS) ×1 IMPLANT
PACK TRENDGUARD 600 HYBRD PROC (MISCELLANEOUS) IMPLANT
PROTECTOR NERVE ULNAR (MISCELLANEOUS) ×4 IMPLANT
SUT VIC AB 3-0 X1 27 (SUTURE) ×2 IMPLANT
SUT VICRYL 0 UR6 27IN ABS (SUTURE) ×2 IMPLANT
SYR 30ML LL (SYRINGE) ×2 IMPLANT
TOWEL OR 17X24 6PK STRL BLUE (TOWEL DISPOSABLE) ×4 IMPLANT
TRENDGUARD 450 HYBRID PRO PACK (MISCELLANEOUS) ×2
TRENDGUARD 600 HYBRID PROC PK (MISCELLANEOUS)
TROCAR XCEL NON-BLD 11X100MML (ENDOMECHANICALS) ×2 IMPLANT
filshie clips IMPLANT

## 2016-06-26 NOTE — Transfer of Care (Signed)
Immediate Anesthesia Transfer of Care Note  Patient: [name]  Procedure(s) Performed: Procedure(s): LAPAROSCOPIC TUBAL LIGATION (Bilateral)  Patient Location: PACU  Anesthesia Type:General  Level of Consciousness: sedated  Airway & Oxygen Therapy: Patient Spontanous Breathing and Patient connected to nasal cannula oxygen  Post-op Assessment: Report given to RN and Post -op Vital signs reviewed and stable  Post vital signs: stable  Last Vitals:  Vitals:   06/26/16 1013  BP: 124/88  Pulse: 80  Resp: 16  Temp: 36.7 C    Last Pain:  Vitals:   06/26/16 1013  TempSrc: Oral      Patients Stated Pain Goal: 3 (06/26/16 1013)  Complications: No apparent anesthesia complications

## 2016-06-26 NOTE — Interval H&P Note (Signed)
History and Physical Interval Note 06/26/2016 10:54 AM  [name]  has presented today for surgery, with the diagnosis of UNDESIRED FERITILITY  The various methods of treatment have been discussed with the patient and family. After consideration of risks, benefits and other options for treatment, the patient has consented to  Procedure(s): LAPAROSCOPIC TUBAL LIGATION (Bilateral) as a surgical intervention.  The patient's history has been reviewed, patient examined, no change in status, stable for surgery.  I have reviewed the patient's chart and labs.  Questions were answered to the patient's satisfaction.  Patient understands that procedure can be stopped if there is significant adhesions and increased risk of visceral injury; BTS could not be done a time of her cesarean section secondary to dense adhesions that were noted.   [name]  Deandre Brannan, MD, FACOG Attending Obstetrician & Gynecologist, [facility], [facility]

## 2016-06-26 NOTE — Anesthesia Preprocedure Evaluation (Addendum)
Anesthesia Evaluation  Patient identified by MRN, date of birth, ID band Patient awake    Reviewed: Allergy & Precautions, H&P , NPO status , Patient's Chart, lab work & pertinent test results, reviewed documented beta blocker date and time   Airway Mallampati: I  TM Distance: >3 FB Neck ROM: full    Dental no notable dental hx. (+) Teeth Intact   Pulmonary    Pulmonary exam normal        Cardiovascular negative cardio ROS Normal cardiovascular exam     Neuro/Psych negative psych ROS   GI/Hepatic Neg liver ROS, GERD  Medicated and Controlled,  Endo/Other  negative endocrine ROS  Renal/GU negative Renal ROS     Musculoskeletal negative musculoskeletal ROS (+)   Abdominal Normal abdominal exam  (+)   Peds  Hematology negative hematology ROS (+)   Anesthesia Other Findings   Reproductive/Obstetrics negative OB ROS                             Anesthesia Physical Anesthesia Plan  ASA: II  Anesthesia Plan: General   Post-op Pain Management:    Induction: Intravenous  Airway Management Planned: Oral ETT  Additional Equipment:   Intra-op Plan:   Post-operative Plan:   Informed Consent: I have reviewed the patients History and Physical, chart, labs and discussed the procedure including the risks, benefits and alternatives for the proposed anesthesia with the patient or authorized representative who has indicated his/her understanding and acceptance.   Dental Advisory Given  Plan Discussed with: CRNA and Surgeon  Anesthesia Plan Comments:         Anesthesia Quick Evaluation

## 2016-06-26 NOTE — Op Note (Signed)
[place] 06/26/2016  PREOPERATIVE DIAGNOSIS:  Undesired fertility  POSTOPERATIVE DIAGNOSIS:  Undesired fertility  PROCEDURE:  Laparoscopic Bilateral Tubal Sterilization using Filshie Clips   SURGEON: [name] Darbie Biancardi, MD  ANESTHESIA:  General endotracheal  COMPLICATIONS:  None immediate.  ESTIMATED BLOOD LOSS:  10 ml.  INDICATIONS: 35 y.o. [id number]  with undesired fertility, desires permanent sterilization. This procedure was not able to be done during recent cesarean section secondary to adhesions preventing access to the adnexa.  Other reversible forms of contraception were discussed with patient; she declines all other modalities.  Risks of procedure discussed with patient including permanence of method, bleeding, infection, injury to surrounding organs and need for additional procedures including laparotomy, risk of regret.  Failure risk of 0.5-1% with increased risk of ectopic gestation if pregnancy occurs was also discussed with patient.      FINDINGS:  Anterior uterus was densely adherent to anterior abdominal wall; very thick and broad adhesion band.  Normal tubes and ovaries. Some adhesions noted around left adnexa which made accessing that side more difficult compared to right. Normal upper abdomen.  TECHNIQUE:  The patient was taken to the operating room where general anesthesia was obtained without difficulty.  She was then placed in the dorsal lithotomy position and prepared and draped in sterile fashion.  After an adequate timeout was performed, a bivalved speculum was then placed in the patient's vagina, and the anterior lip of cervix grasped with the single-tooth tenaculum.  The uterine manipulator was then advanced into the uterus.  The speculum was removed from the vagina.  Attention was then turned to the patient's abdomen where a 11-mm skin incision was made in the umbilical fold.  The Optiview 11-mm trocar and sleeve were then advanced without difficulty with the  laparoscope under direct visualization into the abdomen.  The abdomen was then insufflated with carbon dioxide gas and adequate pneumoperitoneum was obtained.  A survey of the patient's pelvis and abdomen revealed the aforementioned findings.  The fallopian tubes were observed and found to be normal in appearance. The Filshie clip applicator was placed through the operative port, and a Filshie clip was placed on the right fallopian tube, about 3 cm from the cornual attachment, with care given to incorporate the underlying mesosalpinx.  A similar process was carried out on the contralateral side allowing for bilateral tubal sterilization.   Good hemostasis was noted overall.  Local analgesia was drizzled on both operative sites.The instruments were then removed from the patient's abdomen and the fascial incision was repaired with 0 Vicryl, and the skin was closed with Dermabond.  The uterine manipulator and the tenaculum were removed from the vagina without complications. The patient tolerated the procedure well.  Sponge, lap, and needle counts were correct times two.  The patient was then taken to the recovery room awake, extubated and in stable condition.  The patient will be discharged to home as per PACU criteria.  Routine postoperative instructions given.  She was prescribed Percocet, Ibuprofen (already had these at home after cesarean section).  She will follow up in the clinic in 3-4 weeks for postoperative evaluation.    [name]  William Schake, MD, FACOG Attending Obstetrician & Gynecologist, [facility], [facility]

## 2016-06-26 NOTE — Discharge Instructions (Signed)
Laparoscopic Tubal Ligation, Care After Refer to this sheet in the next few weeks. These instructions provide you with information about caring for yourself after your procedure. Your health care provider may also give you more specific instructions. Your treatment has been planned according to current medical practices, but problems sometimes occur. Call your health care provider if you have any problems or questions after your procedure. What can I expect after the procedure? After the procedure, it is common to have:  A sore throat.  Discomfort in your shoulder.  Mild discomfort or cramping in your abdomen.  Gas pains.  Pain or soreness in the area where the surgical cut (incision) was made.  A bloated feeling.  Tiredness.  Nausea.  Vomiting. Follow these instructions at home: Medicines  Take over-the-counter and prescription medicines only as told by your health care provider.  Do not take aspirin because it can cause bleeding.  Do not drive or operate heavy machinery while taking prescription pain medicine. Activity  Rest for the rest of the day.  Return to your normal activities as told by your health care provider. Ask your health care provider what activities are safe for you. Incision care  Follow instructions from your health care provider about how to take care of your incision. Make sure you:  Wash your hands with soap and water before you change your bandage (dressing). If soap and water are not available, use hand sanitizer.  Change your dressing as told by your health care provider.  Leave stitches (sutures) in place. They may need to stay in place for 2 weeks or longer.  Check your incision area every day for signs of infection. Check for:  More redness, swelling, or pain.  More fluid or blood.  Warmth.  Pus or a bad smell. Other Instructions  Do not take baths, swim, or use a hot tub until your health care provider approves. You may take  showers.  Keep all follow-up visits as told by your health care provider. This is important.  Have someone help you with your daily household tasks for the first few days. Contact a health care provider if:  You have more redness, swelling, or pain around your incision.  Your incision feels warm to the touch.  You have pus or a bad smell coming from your incision.  The edges of your incision break open after the sutures have been removed.  Your pain does not improve after 2-3 days.  You have a rash.  You repeatedly become dizzy or light-headed.  Your pain medicine is not helping.  You are constipated. Get help right away if:  You have a fever.  You faint.  You have increasing pain in your abdomen.  You have severe pain in one or both of your shoulders.  You have fluid or blood coming from your sutures or from your vagina.  You have shortness of breath or difficulty breathing.  You have chest pain or leg pain.  You have ongoing nausea, vomiting, or diarrhea. This information is not intended to replace advice given to you by your health care provider. Make sure you discuss any questions you have with your health care provider. Document Released: 01/10/2005 Document Revised: 11/26/2015 Document Reviewed: 06/03/2015 Elsevier Interactive Patient Education  2017 Else[employer]   DISCHARGE INSTRUCTIONS: Laparoscopy  The following instructions have been prepared to help you care for yourself upon your return home today.  Wound care:  Do not get the incision wet for the first 24  hours. The incision should be kept clean and dry.  The Band-Aids or dressings may be removed the day after surgery.  Should the incision become sore, red, and swollen after the first week, check with your doctor.  Personal hygiene:  Shower the day after your procedure.  Activity and limitations:  Do NOT drive or operate any equipment today.  Do NOT lift anything more than 15 pounds for  2-3 weeks after surgery.  Do NOT rest in bed all day.  Walking is encouraged. Walk each day, starting slowly with 5-minute walks 3 or 4 times a day. Slowly increase the length of your walks.  Walk up and down stairs slowly.  Do NOT do strenuous activities, such as golfing, playing tennis, bowling, running, biking, weight lifting, gardening, mowing, or vacuuming for 2-4 weeks. Ask your doctor when it is okay to start.  Diet: Eat a light meal as desired this evening. You may resume your usual diet tomorrow.  Return to work: This is dependent on the type of work you do. For the most part you can return to a desk job within a week of surgery. If you are more active at work, please discuss this with your doctor.  What to expect after your surgery: You may have a slight burning sensation when you urinate on the first day. You may have a very small amount of blood in the urine. Expect to have a small amount of vaginal discharge/light bleeding for 1-2 weeks. It is not unusual to have abdominal soreness and bruising for up to 2 weeks. You may be tired and need more rest for about 1 week. You may experience shoulder pain for 24-72 hours. Lying flat in bed may relieve it.  Call your doctor for any of the following:  Develop a fever of 100.4 or greater  Inability to urinate 6 hours after discharge from hospital  Severe pain not relieved by pain medications  Persistent of heavy bleeding at incision site  Redness or swelling around incision site after a week  Increasing nausea or vomiting  Patient Signature________________________________________ Nurse Signature_________________________________________    Post Anesthesia Home Care Instructions  Activity: Get plenty of rest for the remainder of the day. A responsible adult should stay with you for 24 hours following the procedure.  For the next 24 hours, DO NOT: -Drive a car -Advertising copywriterperate machinery -Drink alcoholic beverages -Take any  medication unless instructed by your physician -Make any legal decisions or sign important papers.  Meals: Start with liquid foods such as gelatin or soup. Progress to regular foods as tolerated. Avoid greasy, spicy, heavy foods. If nausea and/or vomiting occur, drink only clear liquids until the nausea and/or vomiting subsides. Call your physician if vomiting continues.  Special Instructions/Symptoms: Your throat may feel dry or sore from the anesthesia or the breathing tube placed in your throat during surgery. If this causes discomfort, gargle with warm salt water. The discomfort should disappear within 24 hours.  If you had a scopolamine patch placed behind your ear for the management of post- operative nausea and/or vomiting:  1. The medication in the patch is effective for 72 hours, after which it should be removed.  Wrap patch in a tissue and discard in the trash. Wash hands thoroughly with soap and water. 2. You may remove the patch earlier than 72 hours if you experience unpleasant side effects which may include dry mouth, dizziness or visual disturbances. 3. Avoid touching the patch. Wash your hands with soap and water  after contact with the patch. °  ° °

## 2016-06-26 NOTE — H&P (View-Only) (Signed)
Post Partum Exam  [name] is a 35 y.o. G2P2002 female who presents for a postpartum visit. She is 6 weeks postpartum following a repeat low transverse cesarean section after failed TOLAC attempt at 41 weeks.  Surgery was remarkable for dense adhesions of the uterus and bladder to anterior abdominal; BTS was unable to be done due to these adhesions. I have fully reviewed the prenatal and intrapartum course. Postpartum course has been unremarkable.  Baby's course has been unremarkable. Baby is feeding by both breast and bottle - Similac Advance. Bleeding no bleeding. Bowel function is normal. Bladder function is normal. Patient is not sexually active. Desired contraception method for now is oral progesterone-only contraceptive, would like BTL at a later time. Postpartum depression screening: negative.  The following portions of the patient's history were reviewed and updated as appropriate: allergies, current medications, past family history, past medical history, past social history, past surgical history and problem list. Normal pap on 12/15/14.  Review of Systems Pertinent items noted in HPI and remainder of comprehensive ROS otherwise negative.    Objective:   BP 116/78 mmHg  Pulse 78  Resp 16  Ht 5' 5" (1.651 m)  Wt 211 lb (95.709 kg)  BMI 35.11 kg/m2  Breastfeeding? Yes  General:  alert and no distress   Breasts:  inspection negative, no nipple discharge or bleeding, no masses or nodularity palpable  Lungs: clear to auscultation bilaterally  Heart:  regular rate and rhythm  Abdomen: soft, non-tender; bowel sounds normal; no masses,  no organomegaly. Incision C/D/I, no erythema, no drainage  Pelvic:  not evaluated        Assessment and Plan:  1. Postpartum examination following cesarean delivery Normal postpartum exam.   2. Encounter for prescription of contraceptive pills - norethindrone (MICRONOR,CAMILA,ERRIN) 0.35 MG tablet; Take 1 tablet (0.35 mg total) by mouth daily.   Dispense: 1 Package; Refill: 11  3. Sterilization consult Given the adhesions noted during cesarean section, offered laparoscopic approach (BTS with Filshie clips).  Other reversible forms of contraception were discussed with patient; she declines all other modalities.  Risks and benefits discussed in detail including but not limited to: risk of regret, permanence of method, bleeding, infection, injury to surrounding organs and need for additional procedures.  Failure risk of 1-2 % with increased risk of ectopic gestation if pregnancy occurs was also discussed with patient.  Patient verbalized understanding of these risks and benefits and wants to proceed with sterilization with laparoscopic bilateral sterilization using Filshie clips.    She was told that she will be contacted by our surgical scheduler regarding the time and date of her surgery; routine preoperative instructions of having nothing to eat or drink after midnight on the day prior to surgery and also coming to the hospital 1 1/2 hours prior to her time of surgery were also emphasized.  She was told she may be called for a preoperative appointment about a week prior to surgery and will be given further preoperative instructions at that visit.  Routine postoperative instructions will be reviewed with the patient and her family in detail after surgery. Printed patient education handouts about the procedure was given to the patient to review at home.   Dee Maday, MD, FACOG Attending Obstetrician & Gynecologist, [facility],  [facility]  

## 2016-06-26 NOTE — Anesthesia Procedure Notes (Signed)
Procedure Name: Intubation Date/Time: 06/26/2016 11:16 AM Performed by: Yolonda KidaARVER, Emmalea Treanor L Pre-anesthesia Checklist: Patient identified, Emergency Drugs available, Suction available and Patient being monitored Patient Re-evaluated:Patient Re-evaluated prior to inductionOxygen Delivery Method: Circle system utilized Preoxygenation: Pre-oxygenation with 100% oxygen Intubation Type: IV induction Ventilation: Mask ventilation without difficulty Laryngoscope Size: Glidescope, Mac and 3 Grade View: Grade II Tube type: Oral Tube size: 7.0 mm Number of attempts: 3 (DL x1 with Miller 2--Grade 3 view, could not pass ETT; DL x1 with MAC 3--Grade 3 view; DL x1 with Glidescope MAC3 with anterior Grade 2 view--able to pass ETT) Airway Equipment and Method: Stylet Placement Confirmation: ETT inserted through vocal cords under direct vision,  positive ETCO2,  CO2 detector and breath sounds checked- equal and bilateral Secured at: 20 cm Tube secured with: Tape Dental Injury: Teeth and Oropharynx as per pre-operative assessment

## 2016-06-26 NOTE — Anesthesia Postprocedure Evaluation (Signed)
Anesthesia Post Note  Patient: [name]  Procedure(s) Performed: Procedure(s) (LRB): LAPAROSCOPIC TUBAL LIGATION (Bilateral)  Patient location during evaluation: PACU Level of consciousness: awake Pain management: pain level controlled Vital Signs Assessment: post-procedure vital signs reviewed and stable Respiratory status: spontaneous breathing Cardiovascular status: stable Postop Assessment: no signs of nausea or vomiting Anesthetic complications: no        Last Vitals:  Vitals:   06/26/16 1330 06/26/16 1359  BP: (!) 149/95   Pulse: 85 87  Resp: 16 16  Temp:  36.7 C    Last Pain:  Vitals:   06/26/16 1359  TempSrc:   PainSc: 2    Pain Goal: Patients Stated Pain Goal: 0 (06/26/16 1300)               Lourdez Mcgahan JR,JOHN Vivianne Carles

## 2016-07-01 ENCOUNTER — Encounter (HOSPITAL_COMMUNITY): Payer: Self-pay | Admitting: Obstetrics & Gynecology

## 2016-07-14 ENCOUNTER — Encounter: Payer: Self-pay | Admitting: Obstetrics & Gynecology

## 2016-07-14 ENCOUNTER — Ambulatory Visit (INDEPENDENT_AMBULATORY_CARE_PROVIDER_SITE_OTHER): Payer: BC Managed Care – PPO | Admitting: Obstetrics & Gynecology

## 2016-07-14 VITALS — BP 123/81 | HR 97 | Resp 18 | Wt 147.0 lb

## 2016-07-14 DIAGNOSIS — Z09 Encounter for follow-up examination after completed treatment for conditions other than malignant neoplasm: Secondary | ICD-10-CM

## 2016-07-14 NOTE — Progress Notes (Signed)
Subjective:     [name] is a 36 y.o. 322P2002 female who presents to the clinic 2 weeks status post laparoscopic bilateral sterilization using Filshie clips for undesired fertility. Eating a regular diet without difficulty. Bowel movements are normal. The patient is not having any pain.  The following portions of the patient's history were reviewed and updated as appropriate: allergies, current medications, past family history, past medical history, past social history, past surgical history and problem list.  Review of Systems Pertinent items noted in HPI and remainder of comprehensive ROS otherwise negative.   Objective:    BP 123/81 (BP Location: Left Arm, Patient Position: Sitting, Cuff Size: Normal)   Pulse 97   Resp 18   Wt 147 lb (66.7 kg)   LMP 06/30/2016   Breastfeeding? No   BMI 23.02 kg/m  General:  alert and no distress  Abdomen: soft, bowel sounds active, non-tender  Incision:   healing well, no drainage, no erythema, no hernia, no seroma, no swelling, no dehiscence, incision well approximated     Assessment:   Doing well postoperatively.  Operative findings again reviewed.    Plan:   1. Continue any current medications. 2. Wound care discussed. 3. Activity restrictions: no lifting more than 15 pounds 4. Anticipated return to work: now. 5. Follow up as needed.  [name]  ANYANWU, MD, FACOG Attending Obstetrician & Gynecologist, [facility], [facility]

## 2016-07-14 NOTE — Patient Instructions (Signed)
Return to clinic for any scheduled appointments or for any gynecologic concerns as needed.   

## 2017-08-11 ENCOUNTER — Ambulatory Visit
Admission: RE | Admit: 2017-08-11 | Discharge: 2017-08-11 | Disposition: A | Discharge status: Home / Self Care | Payer: BC Managed Care – PPO | Source: Ambulatory Visit | Attending: Family Medicine | Admitting: Family Medicine

## 2017-08-11 ENCOUNTER — Other Ambulatory Visit: Payer: Self-pay | Admitting: Family Medicine

## 2017-08-11 DIAGNOSIS — R05 Cough: Secondary | ICD-10-CM

## 2017-08-11 DIAGNOSIS — R059 Cough, unspecified: Secondary | ICD-10-CM

## 2021-03-13 ENCOUNTER — Ambulatory Visit: Payer: BC Managed Care – PPO | Admitting: Family Medicine

## 2021-04-24 ENCOUNTER — Encounter: Payer: Self-pay | Admitting: Family Medicine

## 2021-04-24 ENCOUNTER — Other Ambulatory Visit (HOSPITAL_COMMUNITY)
Admission: RE | Admit: 2021-04-24 | Discharge: 2021-04-24 | Disposition: A | Discharge status: Home / Self Care | Payer: BC Managed Care – PPO | Source: Ambulatory Visit | Attending: Family Medicine | Admitting: Family Medicine

## 2021-04-24 ENCOUNTER — Other Ambulatory Visit: Payer: Self-pay

## 2021-04-24 ENCOUNTER — Ambulatory Visit (INDEPENDENT_AMBULATORY_CARE_PROVIDER_SITE_OTHER): Payer: BC Managed Care – PPO | Admitting: Family Medicine

## 2021-04-24 VITALS — BP 136/85 | HR 99 | Ht 67.0 in | Wt 177.0 lb

## 2021-04-24 DIAGNOSIS — Z01419 Encounter for gynecological examination (general) (routine) without abnormal findings: Secondary | ICD-10-CM | POA: Diagnosis present

## 2021-04-24 DIAGNOSIS — N939 Abnormal uterine and vaginal bleeding, unspecified: Secondary | ICD-10-CM

## 2021-04-24 DIAGNOSIS — N920 Excessive and frequent menstruation with regular cycle: Secondary | ICD-10-CM | POA: Diagnosis not present

## 2021-04-24 NOTE — Progress Notes (Signed)
   GYNECOLOGY ANNUAL PREVENTATIVE CARE ENCOUNTER NOTE  Subjective:   [place] is a 40 y.o. G71P2002 female here for a routine annual gynecologic exam.  Current complaints: heavy cycles-- last 7 days, bleeding through pads/diapers. Denies dizziness/lightheaded. No known history of fibroids. Cycle is regular.   Denies abnormal vaginal bleeding, discharge, pelvic pain, problems with intercourse or other gynecologic concerns.    Gynecologic History Patient's last menstrual period was 04/05/2021 (exact date). Contraception: tubal ligation Last Pap: 2019. Results were: normal Last mammogram: needs  Health Maintenance Due  Topic Date Due   COVID-19 Vaccine (1) Never done   Hepatitis C Screening  Never done   PAP SMEAR-Modifier  12/14/2017   INFLUENZA VACCINE  02/04/2021    The following portions of the patient's history were reviewed and updated as appropriate: allergies, current medications, past family history, past medical history, past social history, past surgical history and problem list.  Review of Systems Pertinent items are noted in HPI.   Objective:  BP 136/85   Pulse 99   Ht 5\' 7"  (1.702 m)   Wt 177 lb (80.3 kg)   LMP 04/05/2021 (Exact Date)   BMI 27.72 kg/m  CONSTITUTIONAL: Well-developed, well-nourished female in no acute distress.  HENT:  Normocephalic, atraumatic, External right and left ear normal. Oropharynx is clear and moist EYES:  No scleral icterus.  NECK: Normal range of motion, supple, no masses.  Normal thyroid.  SKIN: Skin is warm and dry. No rash noted. Not diaphoretic. No erythema. No pallor. NEUROLOGIC: Alert and oriented to person, place, and time. Normal reflexes, muscle tone coordination. No cranial nerve deficit noted. PSYCHIATRIC: Normal mood and affect. Normal behavior. Normal judgment and thought content. CARDIOVASCULAR: Normal heart rate noted, regular rhythm. 2+ distal pulses. RESPIRATORY: Effort and breath sounds normal, no problems  with respiration noted. BREASTS: Symmetric in size. No masses, skin changes, nipple drainage, or lymphadenopathy. ABDOMEN: Soft,  no distention noted.  No tenderness, rebound or guarding.  PELVIC: Normal appearing external genitalia; normal appearing vaginal mucosa and cervix.  No abnormal discharge noted.  Pap smear obtained.  14 wk uterine size- non tender but enlarged. No discrete masses. no uterine or adnexal tenderness. MUSCULOSKELETAL: Normal range of motion.    Assessment and Plan:  1) Annual gynecologic examination with pap smear:  Will follow up results of pap smear and manage accordingly. Declines STI screen today.  Routine preventative health maintenance measures emphasized.  1. Well woman exam with routine gynecological exam - MM 3D SCREEN BREAST BILATERAL; Future - Cytology - PAP  2. Abnormal uterine bleeding (AUB) - 04/07/2021 PELVIS (TRANSABDOMINAL ONLY); Future  3. Menorrhagia with regular cycle [place]) Reviewed management with OCP, depo, IUD, surgery (ablation) Would like to review with partner and consider options to manage Reviewed different management if fibroids present. Suspect this given enlarged uterus.  - (62130 PELVIS (TRANSABDOMINAL ONLY); Future   Please refer to After Visit Summary for other counseling recommendations.   Return in about 6 months (around 10/23/2021) for heavy cycles.  10/25/2021, MD, MPH, ABFM Attending Physician Center for [facility]

## 2021-04-24 NOTE — Progress Notes (Signed)
Having heavy cycles with blood clots and some cramping

## 2021-04-26 ENCOUNTER — Ambulatory Visit: Payer: Self-pay

## 2021-04-26 LAB — CYTOLOGY - PAP
Adequacy: ABSENT
Comment: NEGATIVE
Diagnosis: NEGATIVE
High risk HPV: NEGATIVE

## 2021-05-03 ENCOUNTER — Ambulatory Visit: Payer: Self-pay

## 2021-05-09 ENCOUNTER — Ambulatory Visit
Admission: RE | Admit: 2021-05-09 | Discharge: 2021-05-09 | Disposition: A | Discharge status: Home / Self Care | Payer: BC Managed Care – PPO | Source: Ambulatory Visit | Attending: Family Medicine | Admitting: Family Medicine

## 2021-05-09 ENCOUNTER — Other Ambulatory Visit: Payer: Self-pay | Admitting: Family Medicine

## 2021-05-09 ENCOUNTER — Other Ambulatory Visit: Payer: Self-pay

## 2021-05-09 DIAGNOSIS — N920 Excessive and frequent menstruation with regular cycle: Secondary | ICD-10-CM

## 2021-05-09 DIAGNOSIS — N939 Abnormal uterine and vaginal bleeding, unspecified: Secondary | ICD-10-CM | POA: Insufficient documentation

## 2021-05-27 ENCOUNTER — Ambulatory Visit: Payer: Self-pay

## 2021-06-26 ENCOUNTER — Ambulatory Visit
Admission: RE | Admit: 2021-06-26 | Discharge: 2021-06-26 | Disposition: A | Discharge status: Home / Self Care | Payer: BC Managed Care – PPO | Source: Ambulatory Visit | Attending: Family Medicine | Admitting: Family Medicine

## 2021-06-26 DIAGNOSIS — Z01419 Encounter for gynecological examination (general) (routine) without abnormal findings: Secondary | ICD-10-CM

## 2021-07-03 ENCOUNTER — Other Ambulatory Visit: Payer: Self-pay | Admitting: Family Medicine

## 2021-07-03 DIAGNOSIS — R928 Other abnormal and inconclusive findings on diagnostic imaging of breast: Secondary | ICD-10-CM

## 2021-08-01 ENCOUNTER — Other Ambulatory Visit: Payer: BC Managed Care – PPO

## 2021-08-12 ENCOUNTER — Other Ambulatory Visit: Payer: BC Managed Care – PPO

## 2021-08-26 ENCOUNTER — Ambulatory Visit
Admission: RE | Admit: 2021-08-26 | Discharge: 2021-08-26 | Disposition: A | Discharge status: Home / Self Care | Payer: BC Managed Care – PPO | Source: Ambulatory Visit | Attending: Family Medicine | Admitting: Family Medicine

## 2021-08-26 ENCOUNTER — Other Ambulatory Visit: Payer: Self-pay | Admitting: Family Medicine

## 2021-08-26 DIAGNOSIS — R928 Other abnormal and inconclusive findings on diagnostic imaging of breast: Secondary | ICD-10-CM

## 2021-08-26 DIAGNOSIS — N6489 Other specified disorders of breast: Secondary | ICD-10-CM

## 2021-09-05 ENCOUNTER — Ambulatory Visit
Admission: RE | Admit: 2021-09-05 | Discharge: 2021-09-05 | Disposition: A | Discharge status: Home / Self Care | Payer: BC Managed Care – PPO | Source: Ambulatory Visit | Attending: Family Medicine | Admitting: Family Medicine

## 2021-09-05 DIAGNOSIS — R928 Other abnormal and inconclusive findings on diagnostic imaging of breast: Secondary | ICD-10-CM

## 2021-09-05 DIAGNOSIS — N6489 Other specified disorders of breast: Secondary | ICD-10-CM

## 2021-09-23 ENCOUNTER — Ambulatory Visit: Payer: Self-pay | Admitting: Surgery

## 2021-09-23 DIAGNOSIS — N6489 Other specified disorders of breast: Secondary | ICD-10-CM

## 2021-09-23 NOTE — H&P (Signed)
? ?Subjective  ? ?Chief Complaint: Breast Mass (Left upper outer quadrant) ?  ? ? ?History of Present Illness: ?[name] is a 41 y.o. female who is seen today as an office consultation at the request of Dr. Alvester Morin for evaluation of Breast Mass (Left upper outer quadrant) ?.   ? ?This is a 41 year old female with a family history of breast cancer in a maternal grandmother who presents after her initial screening mammogram in December revealed a possible mass in the left upper outer quadrant.  She underwent mammogram and ultrasound that revealed no sonographic correlate.  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  She did develop a hematoma after her biopsy. ? ? ?Review of Systems: ?A complete review of systems was obtained from the patient.  I have reviewed this information and discussed as appropriate with the patient.  See HPI as well for other ROS. ? ?Review of Systems  ?Constitutional: Negative.   ?HENT: Positive for congestion.   ?Eyes: Negative.   ?Respiratory: Positive for shortness of breath.   ?Cardiovascular: Negative.   ?Gastrointestinal: Negative.   ?Genitourinary: Negative.   ?Musculoskeletal: Negative.   ?Skin: Negative.   ?Neurological: Negative.   ?Endo/Heme/Allergies: Negative.   ?Psychiatric/Behavioral: Negative.   ?  ? ? ?Medical History: ?Past Medical History:  ?Diagnosis Date  ? Asthma, unspecified asthma severity, unspecified whether complicated, unspecified whether persistent   ? ? ?Patient Active Problem List  ?Diagnosis  ? Hypertriglyceridemia  ? Allergic rhinitis  ? Asthma  ? Complex sclerosing lesion of left breast  ? ? ?Past Surgical History:  ?Procedure Laterality Date  ? CESAREAN SECTION N/A   ?  ? ?Allergies  ?Allergen Reactions  ? Sulfamethoxazole-Trimethoprim Rash  ? ? ?Current Outpatient Medications on File Prior to Visit  ?Medication Sig Dispense Refill  ? albuterol (PROVENTIL) 2.5 mg /3 mL (0.083 %)  nebulizer solution INHALE THE CONTENTS OF 1 VIAL VIA NEBULIZER INHALATION EVERY 4 HRS AS NEEDED    ? montelukast (SINGULAIR) 10 mg tablet 1 tablet    ? ?No current facility-administered medications on file prior to visit.  ? ? ?Family History  ?Problem Relation Age of Onset  ? Diabetes Mother   ? Heart valve disease Father   ?  ? ?Social History  ? ?Tobacco Use  ?Smoking Status Never  ?Smokeless Tobacco Never  ?  ? ?Social History  ? ?Socioeconomic History  ? Marital status: Unknown  ?Tobacco Use  ? Smoking status: Never  ? Smokeless tobacco: Never  ?Vaping Use  ? Vaping Use: Never used  ?Substance and Sexual Activity  ? Alcohol use: Never  ? Drug use: Never  ? ? ?Objective:  ? ? ?Vitals:  ? 09/23/21 1133  ?BP: (!) 130/90  ?Pulse: (!) 116  ?Temp: 36.8 ?C (98.2 ?F)  ?SpO2: 96%  ?Weight: 81.9 kg (180 lb 9.6 oz)  ?Height: 170.2 cm (5\' 7" )  ?  ?Body mass index is 28.29 kg/m?. ? ?Physical Exam  ? ?Constitutional:  WDWN in NAD, conversant, no obvious deformities; lying in bed comfortably ?Eyes:  Pupils equal, round; sclera anicteric; moist conjunctiva; no lid lag ?HENT:  Oral mucosa moist; good dentition  ?Neck:  No masses palpated, trachea midline; no thyromegaly ?Lungs:  CTA bilaterally; normal respiratory effort ?Breasts:  symmetric, no nipple changes; no palpable masses or lymphadenopathy on either side; some residual bruising in the left upper breast. ?CV:  Regular rate and rhythm; no murmurs; extremities well-perfused with no edema ?  Abd:  +bowel sounds, soft, non-tender, no palpable organomegaly; no palpable hernias ?Musc:  Unable to assess gait; no apparent clubbing or cyanosis in extremities ?Lymphatic:  No palpable cervical or axillary lymphadenopathy ?Skin:  Warm, dry; no sign of jaundice ?Psychiatric - alert and oriented x 4; calm mood and affect ? ? ?Labs, Imaging and Diagnostic Testing: ?CLINICAL DATA:  Screening. ?  ?EXAM: ?DIGITAL SCREENING BILATERAL MAMMOGRAM WITH TOMOSYNTHESIS AND CAD ?   ?TECHNIQUE: ?Bilateral screening digital craniocaudal and mediolateral oblique ?mammograms were obtained. Bilateral screening digital breast ?tomosynthesis was performed. The images were evaluated with ?computer-aided detection. ?  ?COMPARISON:  None. ?  ?ACR Breast Density Category d: The breast tissue is extremely dense, ?which lowers the sensitivity of mammography. ?  ?FINDINGS: ?In the left breast, possible distortion warrants further evaluation. ?In the right breast, no findings suspicious for malignancy. ?  ?IMPRESSION: ?Further evaluation is suggested for possible distortion in the left ?breast. ?  ?RECOMMENDATION: ?Diagnostic mammogram and possibly ultrasound of the left breast. ?(Code:FI-L-44M) ?  ?The patient will be contacted regarding the findings, and additional ?imaging will be scheduled. ?  ?BI-RADS CATEGORY  0: Incomplete. Need additional imaging evaluation ?and/or prior mammograms for comparison. ?  ?  ?Electronically Signed ?  By: Sherian Rein M.D. ?  On: 06/27/2021 07:11 ? ?CLINICAL DATA:  Possible distortion in the upper-outer left breast ?on a recent baseline screening mammogram. ?  ?EXAM: ?DIGITAL DIAGNOSTIC UNILATERAL LEFT MAMMOGRAM WITH TOMOSYNTHESIS AND ?CAD; ULTRASOUND LEFT BREAST LIMITED ?  ?TECHNIQUE: ?Left digital diagnostic mammography and breast tomosynthesis was ?performed. The images were evaluated with computer-aided detection.; ?Targeted ultrasound examination of the left breast was performed. ?  ?COMPARISON:  Baseline screening mammogram dated 06/26/2021. ?  ?ACR Breast Density Category d: The breast tissue is extremely dense, ?which lowers the sensitivity of mammography. ?  ?FINDINGS: ?3D tomographic and 2D generated spot compression images of the left ?breast confirm a focal area of distortion in the upper-outer ?quadrant of the breast, in the middle 3rd. ?  ?On physical exam, no mass or thickening is palpable in the upper ?outer right breast. There are no palpable left  axillary lymph nodes. ?  ?Targeted ultrasound is performed, showing multiple small cystic ?areas scattered in the upper-outer quadrant of the left breast. No ?solid masses or areas of distortion are seen. ?  ?Ultrasound of the left axilla demonstrated normal appearing left ?axillary lymph nodes. ?  ?IMPRESSION: ?1. Focal distortion in the upper outer left breast with no ?ultrasound correlate. Differential considerations include malignancy ?and complex sclerosing lesion. ?2. Fibrocystic changes in the upper outer left breast. ?  ?RECOMMENDATION: ?3D stereotactic guided core needle biopsy of the focal distortion in ?the upper-outer left breast. This has been discussed with the ?patient and scheduled 9:30 a.m. on 09/05/2021. ?  ?I have discussed the findings and recommendations with the patient. ?If applicable, a reminder letter will be sent to the patient ?regarding the next appointment. ?  ?BI-RADS CATEGORY  4: Suspicious. ?  ?  ?Electronically Signed ?  By: Beckie Salts M.D. ?  On: 08/26/2021 14:21 ? ?Assessment and Plan:  ?Diagnoses and all orders for this visit: ? ?Complex sclerosing lesion of left breast ? ?  ? ?Left radioactive seed localized lumpectomy.The surgical procedure has been discussed with the patient.  Potential risks, benefits, alternative treatments, and expected outcomes have been explained.  All of the patient's questions at this time have been answered.  The likelihood of reaching the patient's treatment goal is  good.  The patient understand the proposed surgical procedure and wishes to proceed. ? ? ?No follow-ups on file. ? ?Paulette Rockford Delbert HarnessKAI Sabrea Sankey, MD  ?09/23/2021 ?11:46 AM ? ? ?

## 2021-09-25 ENCOUNTER — Other Ambulatory Visit: Payer: Self-pay | Admitting: Surgery

## 2021-09-25 DIAGNOSIS — N6489 Other specified disorders of breast: Secondary | ICD-10-CM

## 2021-10-21 ENCOUNTER — Other Ambulatory Visit: Payer: Self-pay

## 2021-10-21 ENCOUNTER — Encounter (HOSPITAL_BASED_OUTPATIENT_CLINIC_OR_DEPARTMENT_OTHER): Payer: Self-pay | Admitting: Surgery

## 2021-10-28 ENCOUNTER — Ambulatory Visit
Admission: RE | Admit: 2021-10-28 | Discharge: 2021-10-28 | Disposition: A | Discharge status: Home / Self Care | Payer: BC Managed Care – PPO | Source: Ambulatory Visit | Attending: Surgery | Admitting: Surgery

## 2021-10-28 DIAGNOSIS — N6489 Other specified disorders of breast: Secondary | ICD-10-CM

## 2021-10-28 NOTE — Progress Notes (Signed)

## 2021-10-29 ENCOUNTER — Encounter (HOSPITAL_BASED_OUTPATIENT_CLINIC_OR_DEPARTMENT_OTHER): Payer: Self-pay | Admitting: Surgery

## 2021-10-29 ENCOUNTER — Other Ambulatory Visit: Payer: Self-pay

## 2021-10-29 ENCOUNTER — Ambulatory Visit (HOSPITAL_BASED_OUTPATIENT_CLINIC_OR_DEPARTMENT_OTHER): Payer: BC Managed Care – PPO | Admitting: Anesthesiology

## 2021-10-29 ENCOUNTER — Ambulatory Visit (HOSPITAL_BASED_OUTPATIENT_CLINIC_OR_DEPARTMENT_OTHER)
Admission: RE | Admit: 2021-10-29 | Discharge: 2021-10-29 | Disposition: A | Discharge status: Home / Self Care | Payer: BC Managed Care – PPO | Attending: Surgery | Admitting: Surgery

## 2021-10-29 ENCOUNTER — Ambulatory Visit
Admission: RE | Admit: 2021-10-29 | Discharge: 2021-10-29 | Disposition: A | Discharge status: Home / Self Care | Payer: BC Managed Care – PPO | Source: Ambulatory Visit | Attending: Surgery | Admitting: Surgery

## 2021-10-29 ENCOUNTER — Encounter (HOSPITAL_BASED_OUTPATIENT_CLINIC_OR_DEPARTMENT_OTHER)
Admission: RE | Disposition: A | Discharge status: Home / Self Care | Payer: Self-pay | Source: Home / Self Care | Attending: Surgery

## 2021-10-29 DIAGNOSIS — J45909 Unspecified asthma, uncomplicated: Secondary | ICD-10-CM | POA: Diagnosis not present

## 2021-10-29 DIAGNOSIS — N6321 Unspecified lump in the left breast, upper outer quadrant: Secondary | ICD-10-CM | POA: Diagnosis not present

## 2021-10-29 DIAGNOSIS — N6022 Fibroadenosis of left breast: Secondary | ICD-10-CM | POA: Insufficient documentation

## 2021-10-29 DIAGNOSIS — N6489 Other specified disorders of breast: Secondary | ICD-10-CM | POA: Insufficient documentation

## 2021-10-29 DIAGNOSIS — N6082 Other benign mammary dysplasias of left breast: Secondary | ICD-10-CM | POA: Insufficient documentation

## 2021-10-29 DIAGNOSIS — Z803 Family history of malignant neoplasm of breast: Secondary | ICD-10-CM | POA: Insufficient documentation

## 2021-10-29 HISTORY — PX: BREAST LUMPECTOMY WITH RADIOACTIVE SEED LOCALIZATION: SHX6424

## 2021-10-29 LAB — POCT PREGNANCY, URINE: Preg Test, Ur: NEGATIVE

## 2021-10-29 SURGERY — BREAST LUMPECTOMY WITH RADIOACTIVE SEED LOCALIZATION
Anesthesia: General | Site: Breast | Laterality: Left

## 2021-10-29 MED ORDER — FENTANYL CITRATE (PF) 100 MCG/2ML IJ SOLN: 25.0000 ug | INTRAMUSCULAR | Status: DC | PRN

## 2021-10-29 MED ORDER — CHLORHEXIDINE GLUCONATE CLOTH 2 % EX PADS: 6.0000 | MEDICATED_PAD | Freq: Once | CUTANEOUS | Status: DC

## 2021-10-29 MED ORDER — CEFAZOLIN SODIUM-DEXTROSE 2-4 GM/100ML-% IV SOLN
INTRAVENOUS | Status: AC
  Filled 2021-10-29: qty 100

## 2021-10-29 MED ORDER — ACETAMINOPHEN 500 MG PO TABS
1000.0000 mg | ORAL_TABLET | ORAL | Status: AC
  Administered 2021-10-29: 1000 mg via ORAL

## 2021-10-29 MED ORDER — LIDOCAINE HCL (CARDIAC) PF 100 MG/5ML IV SOSY
PREFILLED_SYRINGE | INTRAVENOUS | Status: DC | PRN
  Administered 2021-10-29: 60 mg via INTRAVENOUS

## 2021-10-29 MED ORDER — ONDANSETRON HCL 4 MG/2ML IJ SOLN
INTRAMUSCULAR | Status: AC
  Filled 2021-10-29: qty 2

## 2021-10-29 MED ORDER — ONDANSETRON HCL 4 MG/2ML IJ SOLN
INTRAMUSCULAR | Status: DC | PRN
Start: 2021-10-29 — End: 2021-10-29
  Administered 2021-10-29: 4 mg via INTRAVENOUS

## 2021-10-29 MED ORDER — MIDAZOLAM HCL 2 MG/2ML IJ SOLN
INTRAMUSCULAR | Status: AC
  Filled 2021-10-29: qty 2

## 2021-10-29 MED ORDER — LIDOCAINE 2% (20 MG/ML) 5 ML SYRINGE
INTRAMUSCULAR | Status: AC
  Filled 2021-10-29: qty 5

## 2021-10-29 MED ORDER — PROPOFOL 10 MG/ML IV BOLUS
INTRAVENOUS | Status: AC
  Filled 2021-10-29: qty 20

## 2021-10-29 MED ORDER — CEFAZOLIN SODIUM-DEXTROSE 2-4 GM/100ML-% IV SOLN
2.0000 g | INTRAVENOUS | Status: AC
  Administered 2021-10-29: 2 g via INTRAVENOUS

## 2021-10-29 MED ORDER — BUPIVACAINE-EPINEPHRINE 0.25% -1:200000 IJ SOLN
INTRAMUSCULAR | Status: DC | PRN
  Administered 2021-10-29: 10 mL

## 2021-10-29 MED ORDER — ACETAMINOPHEN 500 MG PO TABS
ORAL_TABLET | ORAL | Status: AC
  Filled 2021-10-29: qty 2

## 2021-10-29 MED ORDER — DEXAMETHASONE SODIUM PHOSPHATE 4 MG/ML IJ SOLN
INTRAMUSCULAR | Status: DC | PRN
Start: 2021-10-29 — End: 2021-10-29
  Administered 2021-10-29: 10 mg via INTRAVENOUS

## 2021-10-29 MED ORDER — DEXAMETHASONE SODIUM PHOSPHATE 10 MG/ML IJ SOLN
INTRAMUSCULAR | Status: AC
  Filled 2021-10-29: qty 1

## 2021-10-29 MED ORDER — MIDAZOLAM HCL 5 MG/5ML IJ SOLN
INTRAMUSCULAR | Status: DC | PRN
  Administered 2021-10-29: 2 mg via INTRAVENOUS

## 2021-10-29 MED ORDER — FENTANYL CITRATE (PF) 100 MCG/2ML IJ SOLN
INTRAMUSCULAR | Status: DC | PRN
  Administered 2021-10-29: 25 ug via INTRAVENOUS
  Administered 2021-10-29: 50 ug via INTRAVENOUS
  Administered 2021-10-29: 25 ug via INTRAVENOUS

## 2021-10-29 MED ORDER — LACTATED RINGERS IV SOLN: INTRAVENOUS | Status: DC

## 2021-10-29 MED ORDER — PROPOFOL 10 MG/ML IV BOLUS
INTRAVENOUS | Status: DC | PRN
  Administered 2021-10-29: 200 mg via INTRAVENOUS
  Administered 2021-10-29: 50 mg via INTRAVENOUS

## 2021-10-29 MED ORDER — FENTANYL CITRATE (PF) 100 MCG/2ML IJ SOLN
INTRAMUSCULAR | Status: AC
  Filled 2021-10-29: qty 2

## 2021-10-29 SURGICAL SUPPLY — 47 items
APL PRP STRL LF DISP 70% ISPRP (MISCELLANEOUS) ×1
APL SKNCLS STERI-STRIP NONHPOA (GAUZE/BANDAGES/DRESSINGS) ×1
APPLIER CLIP 9.375 MED OPEN (MISCELLANEOUS)
APR CLP MED 9.3 20 MLT OPN (MISCELLANEOUS)
BENZOIN TINCTURE PRP APPL 2/3 (GAUZE/BANDAGES/DRESSINGS) ×2 IMPLANT
BLADE HEX COATED 2.75 (ELECTRODE) ×2 IMPLANT
BLADE SURG 15 STRL LF DISP TIS (BLADE) ×1 IMPLANT
BLADE SURG 15 STRL SS (BLADE) ×2
CANISTER SUC SOCK COL 7IN (MISCELLANEOUS) IMPLANT
CANISTER SUCT 1200ML W/VALVE (MISCELLANEOUS) IMPLANT
CHLORAPREP W/TINT 26 (MISCELLANEOUS) ×2 IMPLANT
CLIP APPLIE 9.375 MED OPEN (MISCELLANEOUS) ×1 IMPLANT
COVER BACK TABLE 60X90IN (DRAPES) ×2 IMPLANT
COVER MAYO STAND STRL (DRAPES) ×2 IMPLANT
COVER PROBE W GEL 5X96 (DRAPES) ×2 IMPLANT
DRAPE LAPAROTOMY 100X72 PEDS (DRAPES) ×2 IMPLANT
DRAPE UTILITY XL STRL (DRAPES) ×2 IMPLANT
DRSG TEGADERM 4X4.75 (GAUZE/BANDAGES/DRESSINGS) ×2 IMPLANT
ELECT REM PT RETURN 9FT ADLT (ELECTROSURGICAL) ×2
ELECTRODE REM PT RTRN 9FT ADLT (ELECTROSURGICAL) ×1 IMPLANT
GAUZE SPONGE 4X4 12PLY STRL LF (GAUZE/BANDAGES/DRESSINGS) ×1 IMPLANT
GLOVE BIO SURGEON STRL SZ7 (GLOVE) ×3 IMPLANT
GLOVE BIOGEL PI IND STRL 7.0 (GLOVE) IMPLANT
GLOVE BIOGEL PI IND STRL 7.5 (GLOVE) ×1 IMPLANT
GLOVE BIOGEL PI INDICATOR 7.0 (GLOVE) ×1
GLOVE BIOGEL PI INDICATOR 7.5 (GLOVE) ×1
GOWN STRL REUS W/ TWL LRG LVL3 (GOWN DISPOSABLE) ×2 IMPLANT
GOWN STRL REUS W/TWL LRG LVL3 (GOWN DISPOSABLE) ×4
KIT MARKER MARGIN INK (KITS) ×2 IMPLANT
NDL HYPO 25X1 1.5 SAFETY (NEEDLE) ×1 IMPLANT
NEEDLE HYPO 25X1 1.5 SAFETY (NEEDLE) ×2 IMPLANT
NS IRRIG 1000ML POUR BTL (IV SOLUTION) ×2 IMPLANT
PACK BASIN DAY SURGERY FS (CUSTOM PROCEDURE TRAY) ×2 IMPLANT
PENCIL SMOKE EVACUATOR (MISCELLANEOUS) ×2 IMPLANT
SLEEVE SCD COMPRESS KNEE MED (STOCKING) ×2 IMPLANT
SPONGE T-LAP 4X18 ~~LOC~~+RFID (SPONGE) ×2 IMPLANT
STRIP CLOSURE SKIN 1/2X4 (GAUZE/BANDAGES/DRESSINGS) ×2 IMPLANT
SUT MON AB 4-0 PC3 18 (SUTURE) ×2 IMPLANT
SUT SILK 2 0 SH (SUTURE) IMPLANT
SUT VIC AB 3-0 SH 27 (SUTURE) ×2
SUT VIC AB 3-0 SH 27X BRD (SUTURE) ×1 IMPLANT
SYR BULB EAR ULCER 3OZ GRN STR (SYRINGE) IMPLANT
SYR CONTROL 10ML LL (SYRINGE) ×2 IMPLANT
TOWEL GREEN STERILE FF (TOWEL DISPOSABLE) ×2 IMPLANT
TRAY FAXITRON CT DISP (TRAY / TRAY PROCEDURE) ×2 IMPLANT
TUBE CONNECTING 20X1/4 (TUBING) IMPLANT
YANKAUER SUCT BULB TIP NO VENT (SUCTIONS) IMPLANT

## 2021-10-29 NOTE — Op Note (Signed)
Pre-op Diagnosis:  Left breast complex sclerosing lesion ?Post-op Diagnosis: same ?Procedure:  Left radioactive seed localized lumpectomy ?Surgeon:  Maia Petties. ?Anesthesia:  GEN - LMA ?Indications:    ?This is a 41 year old female with a family history of breast cancer in a maternal grandmother who presents after her initial screening mammogram in December revealed a possible mass in the left upper outer quadrant.  She underwent mammogram and ultrasound that revealed no sonographic correlate.  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  ? ?Description of procedure: The patient is brought to the operating room placed in supine position on the operating room table. After an adequate level of general anesthesia was obtained, her left breast was prepped with ChloraPrep and draped in sterile fashion. A timeout was taken to ensure the proper patient and proper procedure. We interrogated the breast with the neoprobe. We made a circumareolar incision around the lateral side of the nipple after infiltrating with 0.25% Marcaine. Dissection was carried down in the breast tissue with cautery. We used the neoprobe to guide [place] towards the radioactive seed. We excised an area of tissue around the radioactive seed 2 cm in diameter. The specimen was removed and was oriented with a paint kit. Specimen mammogram showed the radioactive seed as well as the biopsy clip within the specimen. This was sent for pathologic examination. There is no residual radioactivity within the biopsy cavity. We inspected carefully for hemostasis. The wound was thoroughly irrigated. The wound was closed with a deep layer of 3-0 Vicryl and a subcuticular layer of 4-0 Monocryl. Benzoin Steri-Strips were applied. The patient was then extubated and brought to the recovery room in stable condition. All sponge, instrument, and needle counts are correct. ? ?[name]. Lynn Belue, MD,  FACS ?[facility]  ?General/ Trauma Surgery ? ?10/29/2021 ?11:56 AM ? ? ?

## 2021-10-29 NOTE — Transfer of Care (Signed)
Immediate Anesthesia Transfer of Care Note ? ?Patient: [name] ? ?Procedure(s) Performed: LEFT BREAST LUMPECTOMY WITH RADIOACTIVE SEED LOCALIZATION (Left: Breast) ? ?Patient Location: PACU ? ?Anesthesia Type:General ? ?Level of Consciousness: sedated ? ?Airway & Oxygen Therapy: Patient Spontanous Breathing and Patient connected to face mask ? ?Post-op Assessment: Report given to RN and Post -op Vital signs reviewed and stable ? ?Post vital signs: Reviewed and stable ? ?Last Vitals:  ?Vitals Value Taken Time  ?BP 99/62 10/29/21 1203  ?Temp    ?Pulse 79 10/29/21 1204  ?Resp 14 10/29/21 1204  ?SpO2 99 % 10/29/21 1204  ?Vitals shown include unvalidated device data. ? ?Last Pain:  ?Vitals:  ? 10/29/21 0949  ?TempSrc: Oral  ?PainSc: 0-No pain  ?   ? ?Patients Stated Pain Goal: 4 (10/29/21 0949) ? ?Complications: No notable events documented. ?

## 2021-10-29 NOTE — Discharge Instructions (Addendum)
[facility] ?Office Phone Number [phone] ? ?BREAST BIOPSY/ PARTIAL MASTECTOMY: POST OP INSTRUCTIONS ? ?Always review your discharge instruction sheet given to you by the facility where your surgery was performed. ? ?IF YOU HAVE DISABILITY OR FAMILY LEAVE FORMS, YOU MUST BRING THEM TO THE OFFICE FOR PROCESSING.  DO NOT GIVE THEM TO YOUR DOCTOR. ? ?A prescription for pain medication may be given to you upon discharge.  Take your pain medication as prescribed, if needed.  If narcotic pain medicine is not needed, then you may take acetaminophen (Tylenol) or ibuprofen (Advil) as needed. ?Take your usually prescribed medications unless otherwise directed ?If you need a refill on your pain medication, please contact your pharmacy.  They will contact our office to request authorization.  Prescriptions will not be filled after 5pm or on week-ends. ?You should eat very light the first 24 hours after surgery, such as soup, crackers, pudding, etc.  Resume your normal diet the day after surgery. ?Most patients will experience some swelling and bruising in the breast.  Ice packs and a good support bra will help.  Swelling and bruising can take several days to resolve.  ?It is common to experience some constipation if taking pain medication after surgery.  Increasing fluid intake and taking a stool softener will usually help or prevent this problem from occurring.  A mild laxative (Milk of Magnesia or Miralax) should be taken according to package directions if there are no bowel movements after 48 hours. ?Unless discharge instructions indicate otherwise, you may remove your bandages 48 hours after surgery, and you may shower at that time.  You will have steri-strips (small skin tapes) in place directly over the incision.  These strips should be left on the skin for 7-10 days.   Any sutures or staples will be removed at the office during your follow-up visit. ?ACTIVITIES:  You may resume regular daily  activities (gradually increasing) beginning the next day.  Wearing a good support bra or sports bra minimizes pain and swelling.  You may have sexual intercourse when it is comfortable. ?You may drive when you no longer are taking prescription pain medication, you can comfortably wear a seatbelt, and you can safely maneuver your car and apply brakes. ?RETURN TO WORK:  1-2 weeks ?You should see your doctor in the office for a follow-up appointment approximately two weeks after your surgery.  Your doctor?s nurse will typically make your follow-up appointment when she calls you with your pathology report.  Expect your pathology report 2-3 business days after your surgery.  You may call to check if you do not hear from [place] after three days. ?OTHER INSTRUCTIONS: _______________________________________________________________________________________________ _____________________________________________________________________________________________________________________________________ ?_____________________________________________________________________________________________________________________________________ ?_____________________________________________________________________________________________________________________________________ ? ?WHEN TO CALL YOUR DOCTOR: ?Fever over 101.0 ?Nausea and/or vomiting. ?Extreme swelling or bruising. ?Continued bleeding from incision. ?Increased pain, redness, or drainage from the incision. ? ?The clinic staff is available to answer your questions during regular business hours.  Please don?t hesitate to call and ask to speak to one of the nurses for clinical concerns.  If you have a medical emergency, go to the nearest emergency room or call 911.  A surgeon from [facility] is always on call at the hospital. ? ?For further questions, please visit [email or web address]  ? ?*You had 1000 mg of Tylenol at 9:53 am ? ? ?Post Anesthesia Home Care  Instructions ? ?Activity: ?Get plenty of rest for the remainder of the day. A responsible individual must stay with you for 24 hours following the procedure.  ?  For the next 24 hours, DO NOT: ?-Drive a car ?-Paediatric nurse ?-Drink alcoholic beverages ?-Take any medication unless instructed by your physician ?-Make any legal decisions or sign important papers. ? ?Meals: ?Start with liquid foods such as gelatin or soup. Progress to regular foods as tolerated. Avoid greasy, spicy, heavy foods. If nausea and/or vomiting occur, drink only clear liquids until the nausea and/or vomiting subsides. Call your physician if vomiting continues. ? ?Special Instructions/Symptoms: ?Your throat may feel dry or sore from the anesthesia or the breathing tube placed in your throat during surgery. If this causes discomfort, gargle with warm salt water. The discomfort should disappear within 24 hours. ? ?If you had a scopolamine patch placed behind your ear for the management of post- operative nausea and/or vomiting: ? ?1. The medication in the patch is effective for 72 hours, after which it should be removed.  Wrap patch in a tissue and discard in the trash. Wash hands thoroughly with soap and water. ?2. You may remove the patch earlier than 72 hours if you experience unpleasant side effects which may include dry mouth, dizziness or visual disturbances. ?3. Avoid touching the patch. Wash your hands with soap and water after contact with the patch. ?    ?

## 2021-10-29 NOTE — Anesthesia Postprocedure Evaluation (Signed)
Anesthesia Post Note ? ?Patient: [name] ? ?Procedure(s) Performed: LEFT BREAST LUMPECTOMY WITH RADIOACTIVE SEED LOCALIZATION (Left: Breast) ? ?  ? ?Patient location during evaluation: PACU ?Anesthesia Type: General ?Level of consciousness: awake and alert ?Pain management: pain level controlled ?Vital Signs Assessment: post-procedure vital signs reviewed and stable ?Respiratory status: spontaneous breathing, nonlabored ventilation, respiratory function stable and patient connected to nasal cannula oxygen ?Cardiovascular status: blood pressure returned to baseline and stable ?Postop Assessment: no apparent nausea or vomiting ?Anesthetic complications: no ? ? ?No notable events documented. ? ?Last Vitals:  ?Vitals:  ? 10/29/21 1245 10/29/21 1300  ?BP: 129/78 (!) 131/93  ?Pulse: 87 83  ?Resp: 15 18  ?Temp:  36.7 ?C  ?SpO2: 100% 98%  ?  ?Last Pain:  ?Vitals:  ? 10/29/21 1300  ?TempSrc:   ?PainSc: 0-No pain  ? ? ?  ?  ?  ?  ?  ?  ? ?[name] ? ? ? ? ?

## 2021-10-29 NOTE — H&P (Signed)
?Subjective  ?  ?Chief Complaint: Breast Mass (Left upper outer quadrant) ?  ?  ?  ?History of Present Illness: ?[name] is a 41 y.o. female who is seen today as an office consultation at the request of Dr. Alvester Morin for evaluation of Breast Mass (Left upper outer quadrant) ?.   ?  ?This is a 41 year old female with a family history of breast cancer in a maternal grandmother who presents after her initial screening mammogram in December revealed a possible mass in the left upper outer quadrant.  She underwent mammogram and ultrasound that revealed no sonographic correlate.  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  She did develop a hematoma after her biopsy. ?  ?  ?Review of Systems: ?A complete review of systems was obtained from the patient.  I have reviewed this information and discussed as appropriate with the patient.  See HPI as well for other ROS. ?  ?Review of Systems  ?Constitutional: Negative.   ?HENT: Positive for congestion.   ?Eyes: Negative.   ?Respiratory: Positive for shortness of breath.   ?Cardiovascular: Negative.   ?Gastrointestinal: Negative.   ?Genitourinary: Negative.   ?Musculoskeletal: Negative.   ?Skin: Negative.   ?Neurological: Negative.   ?Endo/Heme/Allergies: Negative.   ?Psychiatric/Behavioral: Negative.   ?  ?  ?  ?Medical History: ?    ?Past Medical History:  ?Diagnosis Date  ? Asthma, unspecified asthma severity, unspecified whether complicated, unspecified whether persistent    ?  ?  ?   ?Patient Active Problem List  ?Diagnosis  ? Hypertriglyceridemia  ? Allergic rhinitis  ? Asthma  ? Complex sclerosing lesion of left breast  ?  ?  ?     ?Past Surgical History:  ?Procedure Laterality Date  ? CESAREAN SECTION N/A    ?  ?  ?    ?Allergies  ?Allergen Reactions  ? Sulfamethoxazole-Trimethoprim Rash  ?  ?  ?      ?Current Outpatient Medications on File Prior to Visit  ?Medication Sig Dispense Refill  ?  albuterol (PROVENTIL) 2.5 mg /3 mL (0.083 %) nebulizer solution INHALE THE CONTENTS OF 1 VIAL VIA NEBULIZER INHALATION EVERY 4 HRS AS NEEDED      ? montelukast (SINGULAIR) 10 mg tablet 1 tablet      ?  ?No current facility-administered medications on file prior to visit.  ?  ?  ?     ?Family History  ?Problem Relation Age of Onset  ? Diabetes Mother    ? Heart valve disease Father    ?  ?  ?Social History  ?  ?   ?Tobacco Use  ?Smoking Status Never  ?Smokeless Tobacco Never  ?  ?  ?Social History  ?  ?    ?Socioeconomic History  ? Marital status: Unknown  ?Tobacco Use  ? Smoking status: Never  ? Smokeless tobacco: Never  ?Vaping Use  ? Vaping Use: Never used  ?Substance and Sexual Activity  ? Alcohol use: Never  ? Drug use: Never  ?  ?  ?Objective:  ?  ?  ?   ?Vitals:  ?  09/23/21 1133  ?BP: (!) 130/90  ?Pulse: (!) 116  ?Temp: 36.8 ?C (98.2 ?F)  ?SpO2: 96%  ?Weight: 81.9 kg (180 lb 9.6 oz)  ?Height: 170.2 cm (5\' 7" )  ?  ?Body mass index is 28.29 kg/m?. ?  ?Physical Exam  ?  ?Constitutional:  WDWN in NAD, conversant, no obvious deformities; lying  in bed comfortably ?Eyes:  Pupils equal, round; sclera anicteric; moist conjunctiva; no lid lag ?HENT:  Oral mucosa moist; good dentition  ?Neck:  No masses palpated, trachea midline; no thyromegaly ?Lungs:  CTA bilaterally; normal respiratory effort ?Breasts:  symmetric, no nipple changes; no palpable masses or lymphadenopathy on either side; some residual bruising in the left upper breast. ?CV:  Regular rate and rhythm; no murmurs; extremities well-perfused with no edema ?Abd:  +bowel sounds, soft, non-tender, no palpable organomegaly; no palpable hernias ?Musc:  Unable to assess gait; no apparent clubbing or cyanosis in extremities ?Lymphatic:  No palpable cervical or axillary lymphadenopathy ?Skin:  Warm, dry; no sign of jaundice ?Psychiatric - alert and oriented x 4; calm mood and affect ?  ?  ?Labs, Imaging and Diagnostic Testing: ?CLINICAL DATA:  Screening. ?   ?EXAM: ?DIGITAL SCREENING BILATERAL MAMMOGRAM WITH TOMOSYNTHESIS AND CAD ?  ?TECHNIQUE: ?Bilateral screening digital craniocaudal and mediolateral oblique ?mammograms were obtained. Bilateral screening digital breast ?tomosynthesis was performed. The images were evaluated with ?computer-aided detection. ?  ?COMPARISON:  None. ?  ?ACR Breast Density Category d: The breast tissue is extremely dense, ?which lowers the sensitivity of mammography. ?  ?FINDINGS: ?In the left breast, possible distortion warrants further evaluation. ?In the right breast, no findings suspicious for malignancy. ?  ?IMPRESSION: ?Further evaluation is suggested for possible distortion in the left ?breast. ?  ?RECOMMENDATION: ?Diagnostic mammogram and possibly ultrasound of the left breast. ?(Code:FI-L-98M) ?  ?The patient will be contacted regarding the findings, and additional ?imaging will be scheduled. ?  ?BI-RADS CATEGORY  0: Incomplete. Need additional imaging evaluation ?and/or prior mammograms for comparison. ?  ?  ?Electronically Signed ?  By: Sherian ReinWei-Chen  Lin M.D. ?  On: 06/27/2021 07:11 ?  ?CLINICAL DATA:  Possible distortion in the upper-outer left breast ?on a recent baseline screening mammogram. ?  ?EXAM: ?DIGITAL DIAGNOSTIC UNILATERAL LEFT MAMMOGRAM WITH TOMOSYNTHESIS AND ?CAD; ULTRASOUND LEFT BREAST LIMITED ?  ?TECHNIQUE: ?Left digital diagnostic mammography and breast tomosynthesis was ?performed. The images were evaluated with computer-aided detection.; ?Targeted ultrasound examination of the left breast was performed. ?  ?COMPARISON:  Baseline screening mammogram dated 06/26/2021. ?  ?ACR Breast Density Category d: The breast tissue is extremely dense, ?which lowers the sensitivity of mammography. ?  ?FINDINGS: ?3D tomographic and 2D generated spot compression images of the left ?breast confirm a focal area of distortion in the upper-outer ?quadrant of the breast, in the middle 3rd. ?  ?On physical exam, no mass or thickening is  palpable in the upper ?outer right breast. There are no palpable left axillary lymph nodes. ?  ?Targeted ultrasound is performed, showing multiple small cystic ?areas scattered in the upper-outer quadrant of the left breast. No ?solid masses or areas of distortion are seen. ?  ?Ultrasound of the left axilla demonstrated normal appearing left ?axillary lymph nodes. ?  ?IMPRESSION: ?1. Focal distortion in the upper outer left breast with no ?ultrasound correlate. Differential considerations include malignancy ?and complex sclerosing lesion. ?2. Fibrocystic changes in the upper outer left breast. ?  ?RECOMMENDATION: ?3D stereotactic guided core needle biopsy of the focal distortion in ?the upper-outer left breast. This has been discussed with the ?patient and scheduled 9:30 a.m. on 09/05/2021. ?  ?I have discussed the findings and recommendations with the patient. ?If applicable, a reminder letter will be sent to the patient ?regarding the next appointment. ?  ?BI-RADS CATEGORY  4: Suspicious. ?  ?  ?Electronically Signed ?  By: Viviann SpareSteven  Azucena Kuba M.D. ?  On: 08/26/2021 14:21 ?  ?Assessment and Plan:  ?Diagnoses and all orders for this visit: ?  ?Complex sclerosing lesion of left breast ?  ?  ?  ?Left radioactive seed localized lumpectomy.The surgical procedure has been discussed with the patient.  Potential risks, benefits, alternative treatments, and expected outcomes have been explained.  All of the patient's questions at this time have been answered.  The likelihood of reaching the patient's treatment goal is good.  The patient understand the proposed surgical procedure and wishes to proceed. ?  ? ? ?Wilmon Arms. Tarrell Debes, MD, FACS ?[facility]  ?General Surgery ? ? ?10/29/2021 ?9:54 AM ? ?

## 2021-10-29 NOTE — Anesthesia Procedure Notes (Signed)
Procedure Name: LMA Insertion ?Date/Time: 10/29/2021 11:17 AM ?Performed by: Maryella Shivers, CRNA ?Pre-anesthesia Checklist: Patient identified, Emergency Drugs available, Suction available and Patient being monitored ?Patient Re-evaluated:Patient Re-evaluated prior to induction ?Oxygen Delivery Method: Circle system utilized ?Preoxygenation: Pre-oxygenation with 100% oxygen ?Induction Type: IV induction ?Ventilation: Mask ventilation without difficulty ?LMA: LMA inserted ?LMA Size: 4.0 ?Number of attempts: 1 ?Airway Equipment and Method: Bite block ?Placement Confirmation: positive ETCO2 ?Tube secured with: Tape ?Dental Injury: Teeth and Oropharynx as per pre-operative assessment  ? ? ? ? ?

## 2021-10-29 NOTE — Anesthesia Preprocedure Evaluation (Addendum)
Anesthesia Evaluation  ?Patient identified by MRN, date of birth, ID band ?Patient awake ? ? ? ?Reviewed: ?Allergy & Precautions, NPO status , Patient's Chart, lab work & pertinent test results ? ?Airway ?Mallampati: II ? ?TM Distance: >3 FB ?Neck ROM: Full ? ? ? Dental ?no notable dental hx. ? ?  ?Pulmonary ?asthma ,  ?  ?Pulmonary exam normal ? ? ? ? ? ? ? Cardiovascular ?negative cardio ROS ? ? ?Rhythm:Regular Rate:Normal ? ? ?  ?Neuro/Psych ? Headaches, negative psych ROS  ? GI/Hepatic ?Neg liver ROS, GERD  ,  ?Endo/Other  ?negative endocrine ROS ? Renal/GU ?negative Renal ROS  ?negative genitourinary ?  ?Musculoskeletal ?Left breast lesion  ? Abdominal ?Normal abdominal exam  (+)   ?Peds ? Hematology ?negative hematology ROS ?(+)   ?Anesthesia Other Findings ? ? Reproductive/Obstetrics ? ?  ? ? ? ? ? ? ? ? ? ? ? ? ? ?  ?  ? ? ? ? ? ? ? ? ?Anesthesia Physical ?Anesthesia Plan ? ?ASA: 2 ? ?Anesthesia Plan: General  ? ?Post-op Pain Management:   ? ?Induction: Intravenous ? ?PONV Risk Score and Plan: 3 and Ondansetron, Dexamethasone, Midazolam and Treatment may vary due to age or medical condition ? ?Airway Management Planned: Mask and LMA ? ?Additional Equipment: None ? ?Intra-op Plan:  ? ?Post-operative Plan: Extubation in OR ? ?Informed Consent: I have reviewed the patients History and Physical, chart, labs and discussed the procedure including the risks, benefits and alternatives for the proposed anesthesia with the patient or authorized representative who has indicated his/her understanding and acceptance.  ? ? ? ?Dental advisory given ? ?Plan Discussed with: CRNA ? ?Anesthesia Plan Comments:   ? ? ? ? ? ? ?Anesthesia Quick Evaluation ? ?

## 2021-10-30 ENCOUNTER — Encounter (HOSPITAL_BASED_OUTPATIENT_CLINIC_OR_DEPARTMENT_OTHER): Payer: Self-pay | Admitting: Surgery

## 2021-10-31 LAB — SURGICAL PATHOLOGY

## 2021-11-12 ENCOUNTER — Encounter (HOSPITAL_COMMUNITY): Payer: Self-pay

## 2022-11-02 IMAGING — MG MM PLC BREAST LOC DEV 1ST LESION INC MAMMO GUIDE*L*
7 series · 7 of 7 positions shown · non-contrast
Comparison: Previous exam(s).

CLINICAL DATA: 40-year-old female for radioactive seed localization
of LEFT breast complex sclerosing lesion and lobular neoplasia prior
to excision.

EXAM:
MAMMOGRAPHIC GUIDED RADIOACTIVE SEED LOCALIZATION OF THE LEFT BREAST

[L CC (1 of 3)]
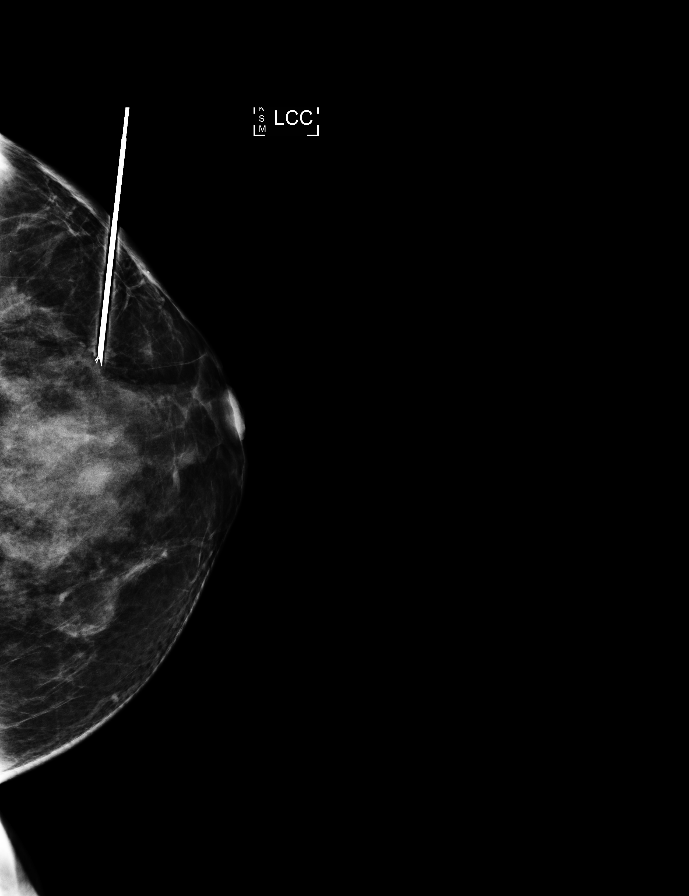

[L LM (1 of 4)]
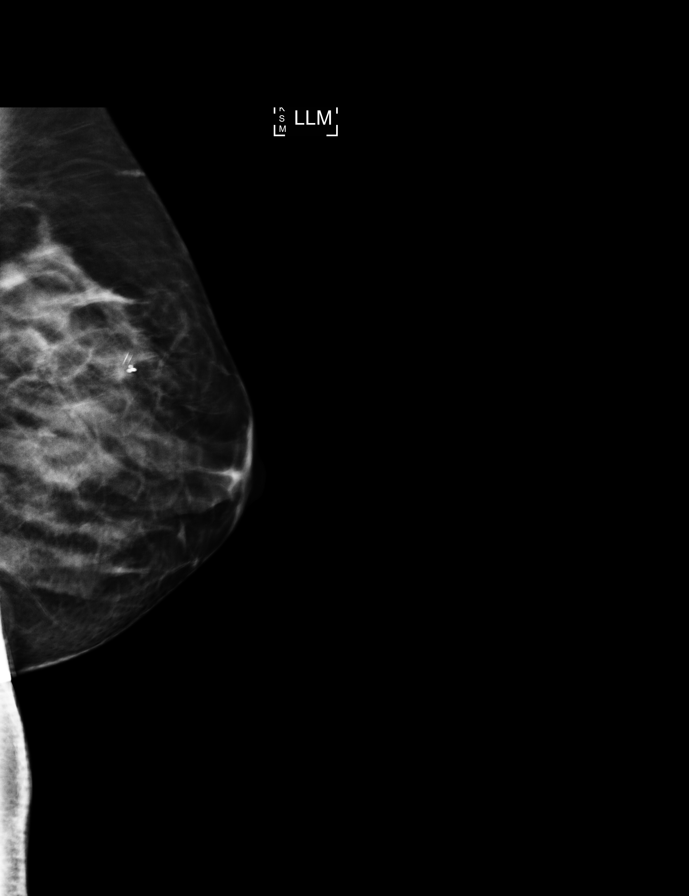

[L CC (2 of 3)]
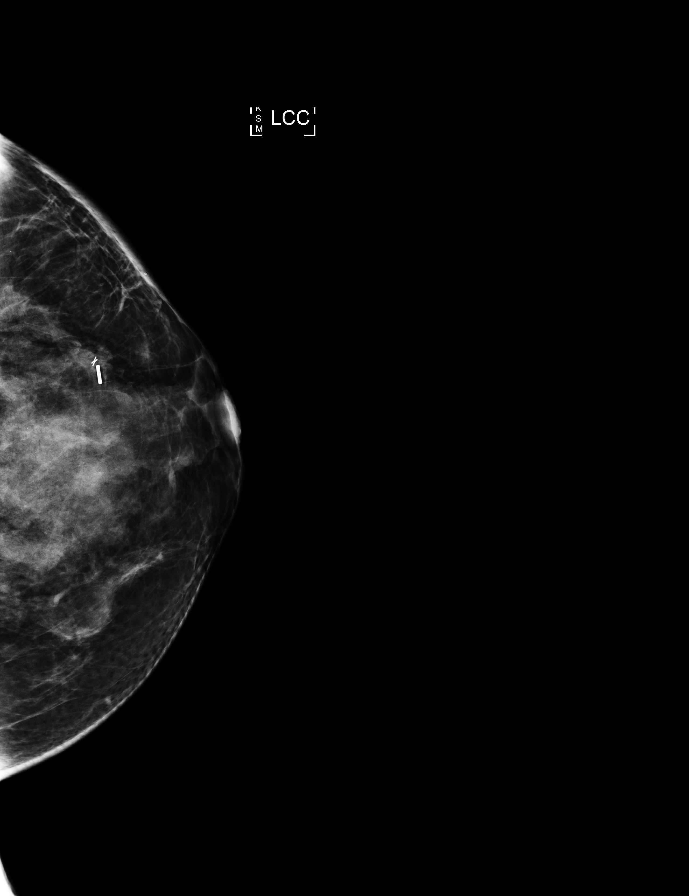

[L LM (2 of 4)]
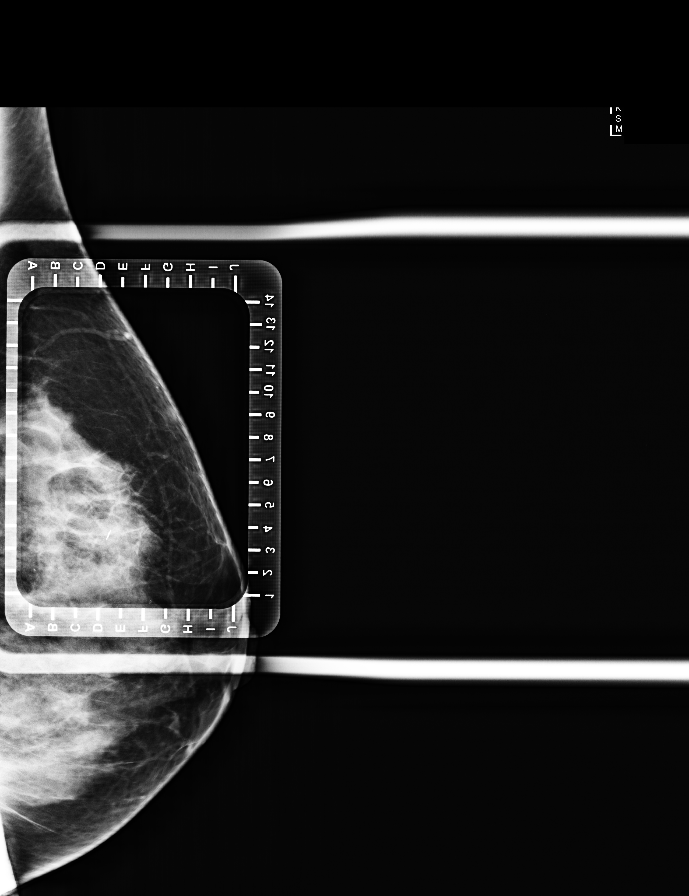

[L LM (3 of 4)]
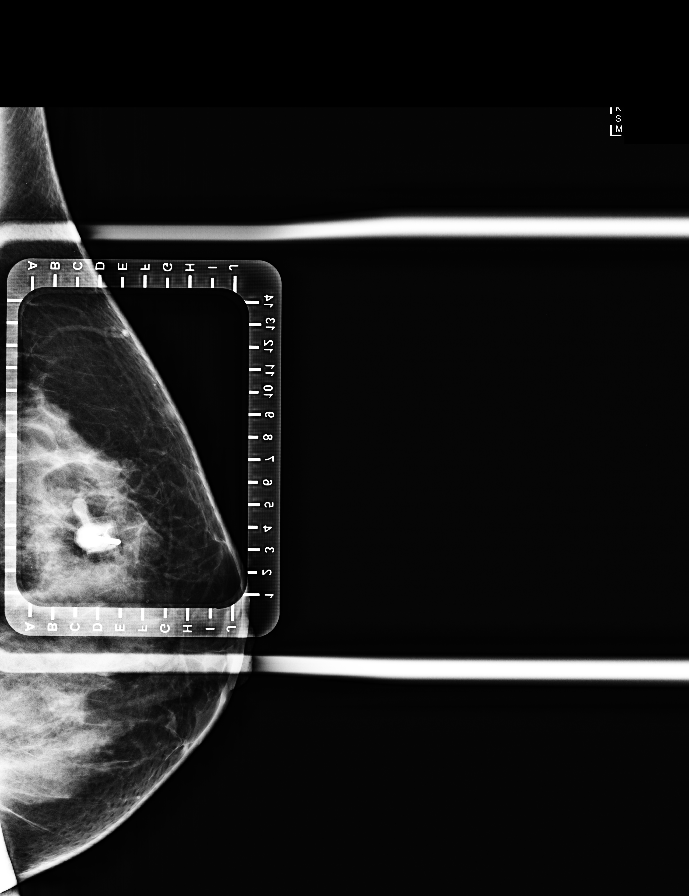

[L LM (4 of 4)]
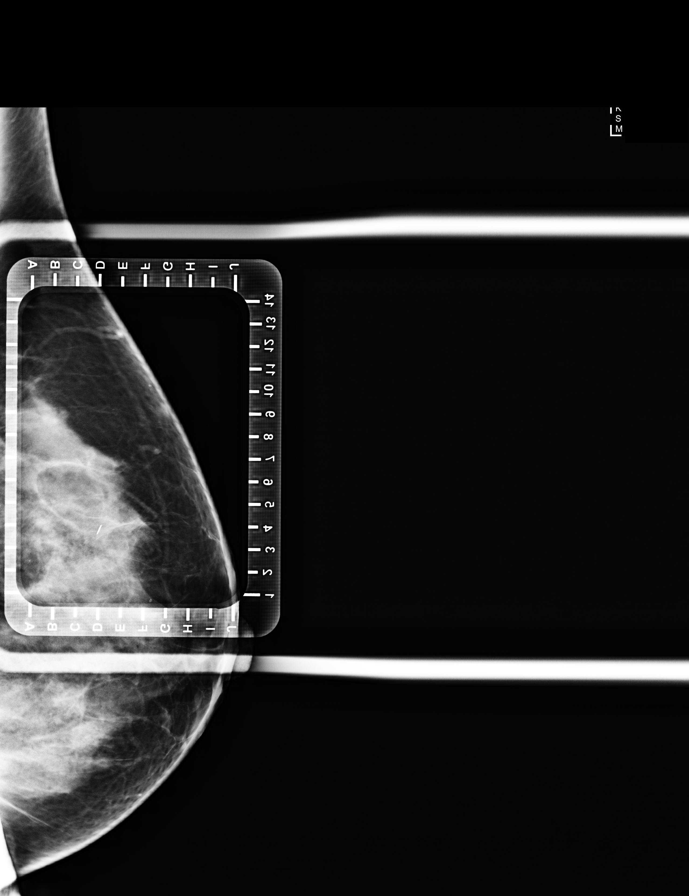

[L CC (3 of 3)]
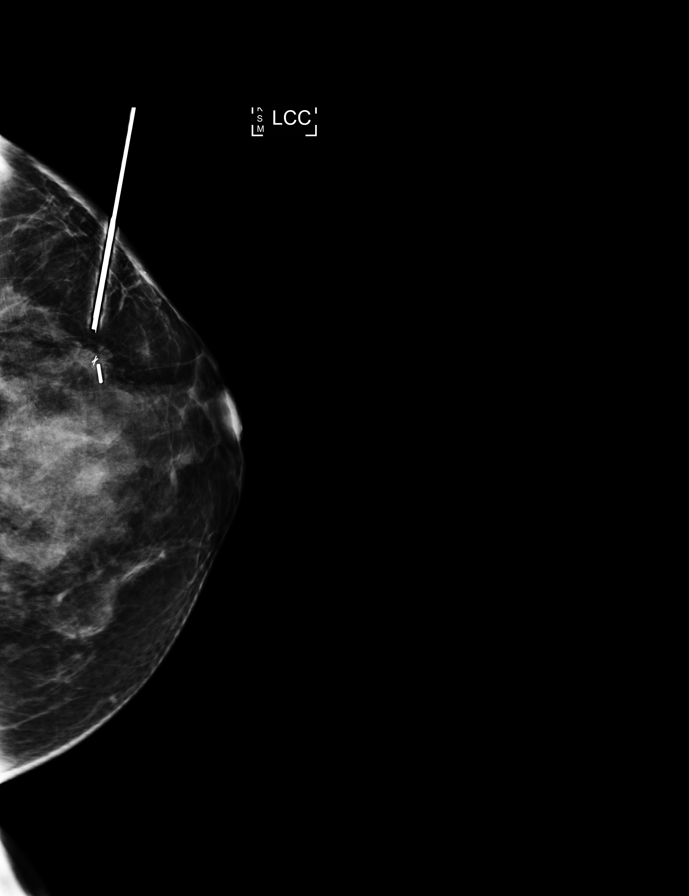

[7 of 7 positions shown; findings below may reference images not displayed]

FINDINGS: Patient presents for radioactive seed localization prior to LEFT
breast excision. I met with the patient and we discussed the
procedure of seed localization including benefits and alternatives.
We discussed the high likelihood of a successful procedure. We
discussed the risks of the procedure including infection, bleeding,
tissue injury and further surgery. We discussed the low dose of
radioactivity involved in the procedure. Informed, written consent
was given.

The usual time-out protocol was performed immediately prior to the
procedure.

Using mammographic guidance, sterile technique, 1% lidocaine and an
[id number] radioactive seed, the X biopsy clip was localized using a
LATERAL approach. The follow-up mammogram images confirm the seed in
the expected location and were marked for Dr. Zaufiran.

Follow-up survey of the patient confirms presence of the radioactive
seed.

Order number of [id number] seed:  [id number].

Total activity:  0.251 millicuries.  Reference Date: 09/25/2021.

The patient tolerated the procedure well and was released from the
[REDACTED]. She was given instructions regarding seed removal.
IMPRESSION: Radioactive seed localization LEFT breast. No apparent
complications.

## 2022-11-03 IMAGING — MG MM BREAST SURGICAL SPECIMEN
1 series · 2 of 2 positions shown · non-contrast
Comparison: Previous exam(s).

CLINICAL DATA: Evaluate specimen.

EXAM:
SPECIMEN RADIOGRAPH OF THE RIGHT BREAST

[Series 1: L · left · 0.07mm/px · 2 of 2 slices shown]
[im 1/2]
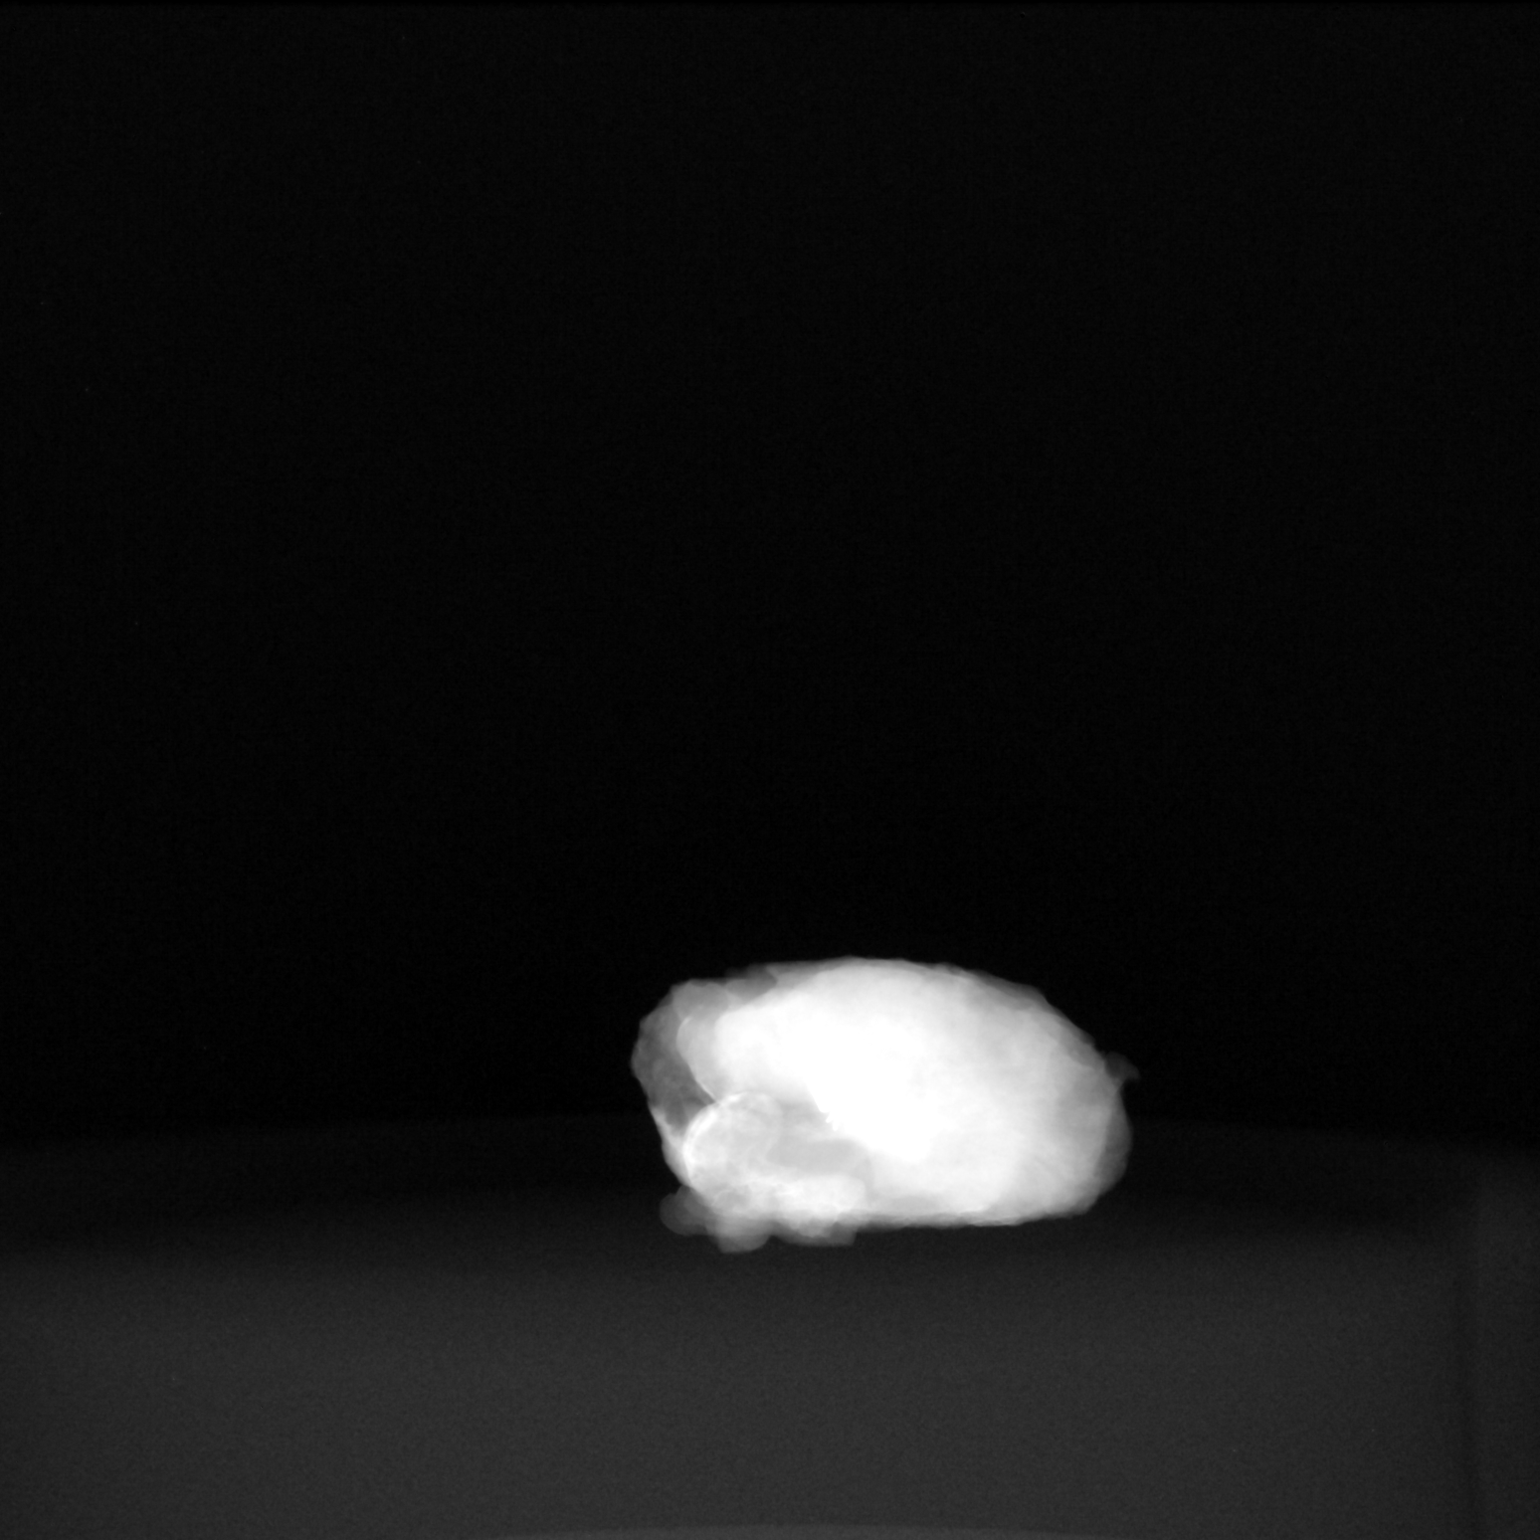
[im 2/2]
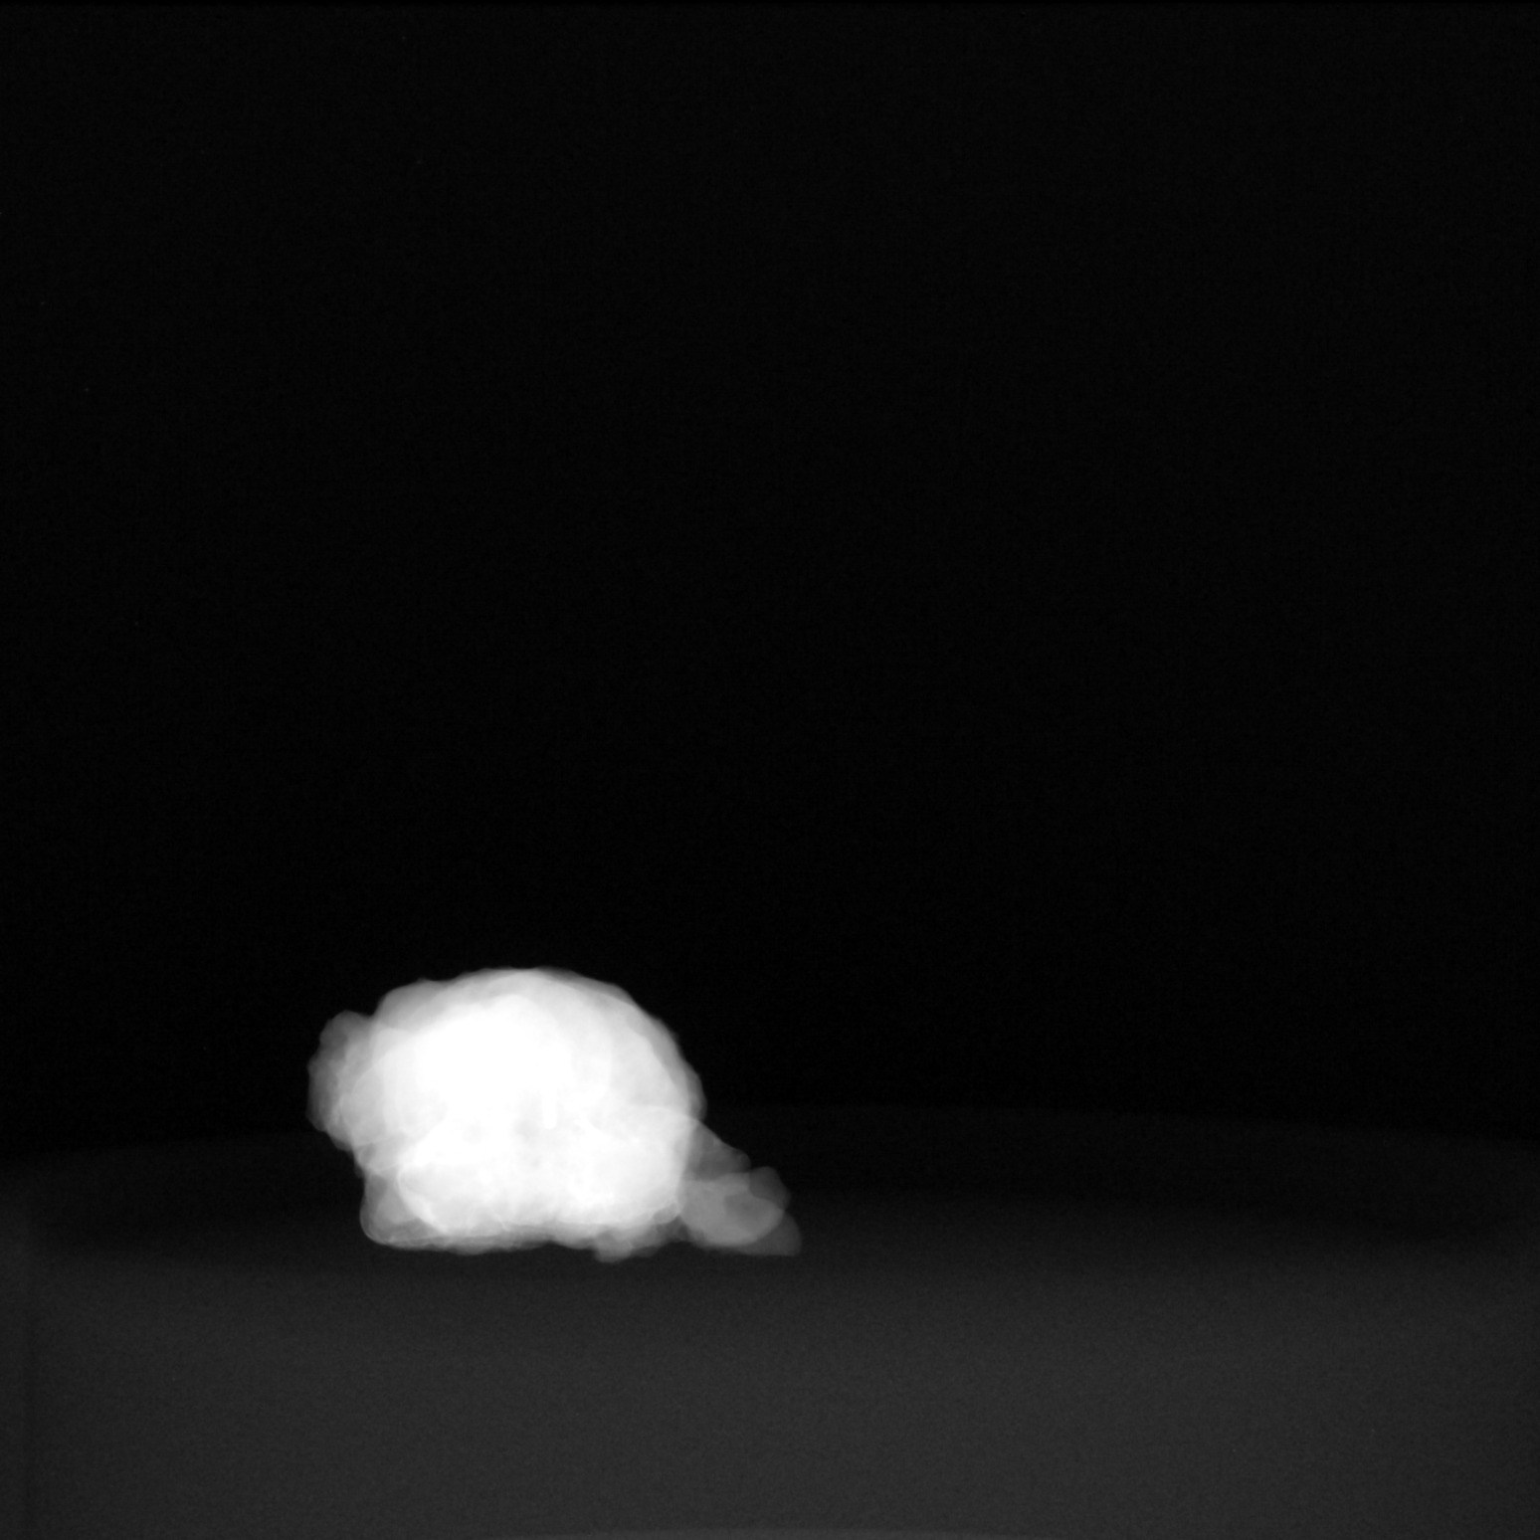

[2 of 2 positions shown; findings below may reference images not displayed]

FINDINGS: Status post excision of the right breast. The radioactive seed and
biopsy marker clip are present, completely intact, and were marked
for pathology.
IMPRESSION: Specimen radiograph of the right breast.
